# Patient Record
Sex: Female | Born: 1940 | Race: White | Hispanic: No | Marital: Married | Smoking: Never smoker
Health system: Southern US, Community
[De-identification: ages and names within clinical notes are randomized; demographics above are authoritative.]

---

## 2003-05-21 ENCOUNTER — Other Ambulatory Visit: Admission: RE | Admit: 2003-05-21 | Discharge: 2003-05-21 | Payer: Self-pay | Admitting: Obstetrics and Gynecology

## 2004-06-01 ENCOUNTER — Other Ambulatory Visit: Admission: RE | Admit: 2004-06-01 | Discharge: 2004-06-01 | Payer: Self-pay | Admitting: Internal Medicine

## 2005-07-14 ENCOUNTER — Other Ambulatory Visit: Admission: RE | Admit: 2005-07-14 | Discharge: 2005-07-14 | Payer: Self-pay | Admitting: Internal Medicine

## 2005-08-10 ENCOUNTER — Encounter: Admission: RE | Admit: 2005-08-10 | Discharge: 2005-08-10 | Payer: Self-pay | Admitting: Internal Medicine

## 2006-04-20 ENCOUNTER — Ambulatory Visit: Payer: Self-pay | Admitting: Internal Medicine

## 2006-04-26 ENCOUNTER — Encounter: Admission: RE | Admit: 2006-04-26 | Discharge: 2006-07-25 | Payer: Self-pay | Admitting: Internal Medicine

## 2006-05-29 ENCOUNTER — Ambulatory Visit: Payer: Self-pay | Admitting: Internal Medicine

## 2006-05-31 ENCOUNTER — Ambulatory Visit: Payer: Self-pay | Admitting: Internal Medicine

## 2006-06-14 ENCOUNTER — Encounter (INDEPENDENT_AMBULATORY_CARE_PROVIDER_SITE_OTHER): Payer: Self-pay | Admitting: *Deleted

## 2006-06-14 ENCOUNTER — Encounter (INDEPENDENT_AMBULATORY_CARE_PROVIDER_SITE_OTHER): Payer: Self-pay | Admitting: Specialist

## 2006-06-14 ENCOUNTER — Ambulatory Visit (HOSPITAL_COMMUNITY): Admission: RE | Admit: 2006-06-14 | Discharge: 2006-06-14 | Payer: Self-pay | Admitting: *Deleted

## 2006-08-17 ENCOUNTER — Ambulatory Visit (HOSPITAL_COMMUNITY): Admission: RE | Admit: 2006-08-17 | Discharge: 2006-08-17 | Payer: Self-pay | Admitting: *Deleted

## 2006-11-29 ENCOUNTER — Encounter: Admission: RE | Admit: 2006-11-29 | Discharge: 2006-11-29 | Payer: Self-pay | Admitting: Internal Medicine

## 2006-11-29 ENCOUNTER — Encounter (INDEPENDENT_AMBULATORY_CARE_PROVIDER_SITE_OTHER): Payer: Self-pay | Admitting: Diagnostic Radiology

## 2006-12-06 ENCOUNTER — Encounter: Admission: RE | Admit: 2006-12-06 | Discharge: 2006-12-06 | Payer: Self-pay | Admitting: Internal Medicine

## 2007-01-30 ENCOUNTER — Encounter (INDEPENDENT_AMBULATORY_CARE_PROVIDER_SITE_OTHER): Payer: Self-pay | Admitting: Surgery

## 2007-01-30 ENCOUNTER — Encounter: Admission: RE | Admit: 2007-01-30 | Discharge: 2007-01-30 | Payer: Self-pay | Admitting: Surgery

## 2007-01-30 ENCOUNTER — Ambulatory Visit (HOSPITAL_COMMUNITY): Admission: RE | Admit: 2007-01-30 | Discharge: 2007-01-30 | Payer: Self-pay | Admitting: Surgery

## 2007-05-01 ENCOUNTER — Ambulatory Visit: Payer: Self-pay | Admitting: Oncology

## 2007-05-16 LAB — COMPREHENSIVE METABOLIC PANEL
ALT: 21 U/L (ref 0–35)
CO2: 26 mEq/L (ref 19–32)
Calcium: 9.5 mg/dL (ref 8.4–10.5)
Chloride: 102 mEq/L (ref 96–112)
Creatinine, Ser: 0.68 mg/dL (ref 0.40–1.20)
Glucose, Bld: 111 mg/dL — ABNORMAL HIGH (ref 70–99)
Total Bilirubin: 0.4 mg/dL (ref 0.3–1.2)

## 2007-05-16 LAB — CBC WITH DIFFERENTIAL/PLATELET
Basophils Absolute: 0 10*3/uL (ref 0.0–0.1)
Eosinophils Absolute: 0.3 10*3/uL (ref 0.0–0.5)
HCT: 38.5 % (ref 34.8–46.6)
HGB: 13.7 g/dL (ref 11.6–15.9)
LYMPH%: 22.8 % (ref 14.0–48.0)
MCV: 84.6 fL (ref 81.0–101.0)
MONO%: 10.4 % (ref 0.0–13.0)
NEUT#: 3.8 10*3/uL (ref 1.5–6.5)
Platelets: 229 10*3/uL (ref 145–400)

## 2007-05-16 LAB — LACTATE DEHYDROGENASE: LDH: 174 U/L (ref 94–250)

## 2007-05-16 LAB — CANCER ANTIGEN 27.29: CA 27.29: 12 U/mL (ref 0–39)

## 2007-05-22 ENCOUNTER — Ambulatory Visit: Admission: RE | Admit: 2007-05-22 | Discharge: 2007-06-26 | Payer: Self-pay | Admitting: Radiation Oncology

## 2007-06-13 ENCOUNTER — Encounter: Admission: RE | Admit: 2007-06-13 | Discharge: 2007-06-13 | Payer: Self-pay | Admitting: Radiation Oncology

## 2007-07-02 ENCOUNTER — Ambulatory Visit: Admission: RE | Admit: 2007-07-02 | Discharge: 2007-08-29 | Payer: Self-pay | Admitting: Radiation Oncology

## 2007-07-02 ENCOUNTER — Ambulatory Visit: Payer: Self-pay | Admitting: Oncology

## 2007-07-31 LAB — CBC WITH DIFFERENTIAL/PLATELET
EOS%: 5.8 % (ref 0.0–7.0)
HGB: 14.2 g/dL (ref 11.6–15.9)
MCH: 30.4 pg (ref 26.0–34.0)
MCV: 86.7 fL (ref 81.0–101.0)
MONO%: 10.3 % (ref 0.0–13.0)
NEUT#: 3.4 10*3/uL (ref 1.5–6.5)
RBC: 4.67 10*6/uL (ref 3.70–5.32)
RDW: 14 % (ref 11.3–14.5)
lymph#: 0.9 10*3/uL (ref 0.9–3.3)

## 2007-08-30 ENCOUNTER — Ambulatory Visit: Admission: RE | Admit: 2007-08-30 | Discharge: 2007-09-13 | Payer: Self-pay | Admitting: Radiation Oncology

## 2007-10-26 ENCOUNTER — Ambulatory Visit: Payer: Self-pay | Admitting: Oncology

## 2008-02-11 ENCOUNTER — Encounter: Admission: RE | Admit: 2008-02-11 | Discharge: 2008-02-11 | Payer: Self-pay | Admitting: Oncology

## 2008-02-20 ENCOUNTER — Encounter: Admission: RE | Admit: 2008-02-20 | Discharge: 2008-02-20 | Payer: Self-pay | Admitting: Obstetrics and Gynecology

## 2008-04-01 ENCOUNTER — Ambulatory Visit: Payer: Self-pay | Admitting: Oncology

## 2008-04-01 LAB — CBC WITH DIFFERENTIAL/PLATELET
Basophils Absolute: 0 10*3/uL (ref 0.0–0.1)
Eosinophils Absolute: 0 10*3/uL (ref 0.0–0.5)
HGB: 12.8 g/dL (ref 11.6–15.9)
MCV: 89.5 fL (ref 81.0–101.0)
NEUT#: 7.5 10*3/uL — ABNORMAL HIGH (ref 1.5–6.5)
RDW: 13 % (ref 11.3–14.5)
lymph#: 0.4 10*3/uL — ABNORMAL LOW (ref 0.9–3.3)

## 2008-10-20 ENCOUNTER — Ambulatory Visit: Payer: Self-pay | Admitting: Oncology

## 2008-10-22 LAB — COMPREHENSIVE METABOLIC PANEL
AST: 17 U/L (ref 0–37)
BUN: 15 mg/dL (ref 6–23)
Calcium: 9.1 mg/dL (ref 8.4–10.5)
Chloride: 102 mEq/L (ref 96–112)
Creatinine, Ser: 0.63 mg/dL (ref 0.40–1.20)
Total Bilirubin: 0.4 mg/dL (ref 0.3–1.2)

## 2008-10-22 LAB — CBC WITH DIFFERENTIAL/PLATELET
Basophils Absolute: 0 10*3/uL (ref 0.0–0.1)
EOS%: 4.2 % (ref 0.0–7.0)
HCT: 38.1 % (ref 34.8–46.6)
HGB: 13.2 g/dL (ref 11.6–15.9)
LYMPH%: 22.6 % (ref 14.0–49.7)
MCH: 30.5 pg (ref 25.1–34.0)
MCV: 88 fL (ref 79.5–101.0)
NEUT%: 65.6 % (ref 38.4–76.8)
Platelets: 150 10*3/uL (ref 145–400)
lymph#: 1 10*3/uL (ref 0.9–3.3)

## 2008-10-22 LAB — CANCER ANTIGEN 27.29: CA 27.29: 16 U/mL (ref 0–39)

## 2008-11-17 LAB — RESEARCH LABS

## 2008-11-18 IMAGING — MG MM DIAGNOSTIC BILATERAL
8 series · 8 of 8 positions shown · non-contrast
Comparison: [DATE] [DATE], [DATE], [DATE] [DATE], [DATE], [DATE] [DATE], [DATE], [DATE]

Addendum Begins

The patient returned on 02/20/2008 for scheduled stereotactic
biopsy.
Prior mammogram from [HOSPITAL] dated 08/17/2004 and
05/27/2003 have now been obtained. The two clusters of
calcifications within the central and lateral right breast
recommended for biopsy, were present on the prior studies from 3669
and 7669.  The calcifications on the 02/11/2008 mammogram are more
conspicuous, but felt to be secondary to digital technique.  These
calcifications also have a benign type appearance.
These right breast calcifications, given their stability and
appearance are felt to be benign.  These findings were discussed
with the patient and the stereotactic biopsy cancelled.
Recommend 6-month diagnostic right mammogram for follow-up, which
was agreeable to the patient.
BI-RADS CATEGORY 3:  Probably benign finding(s) - short interval
follow-up suggested.
Recommend right diagnostic mammogram in 6 months.
Addendum Ends
CLINICAL DATA: History of left breast cancer status post lumpectomy
in 0335.
DIGITAL DIAGNOSTIC BILATERAL MAMMOGRAM WITH COMPUTER AIDED
DETECTION February 11, 2008

[R CC (1 of 3)]
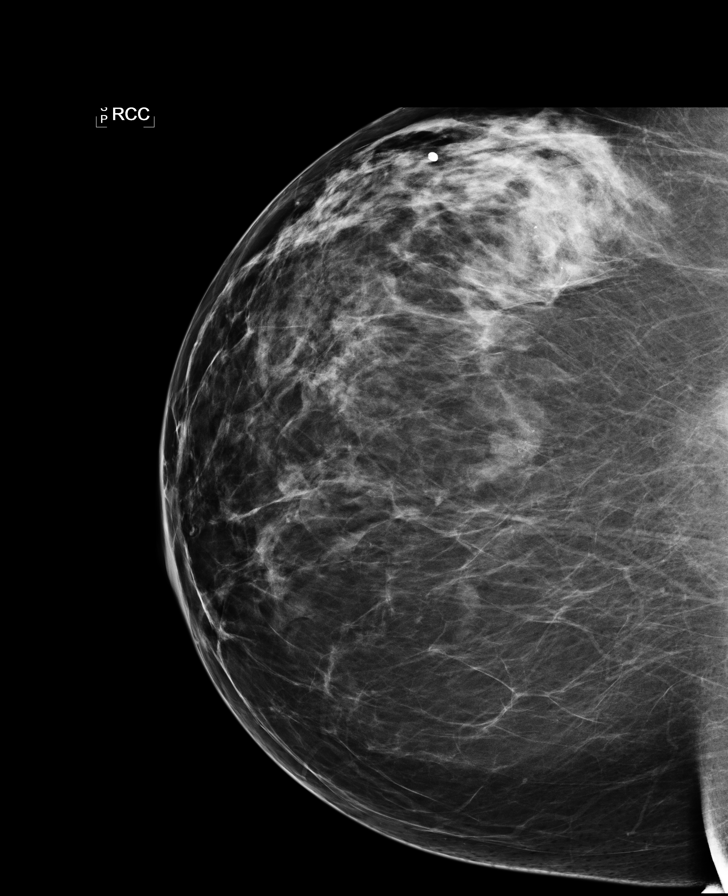

[R CC (2 of 3)]
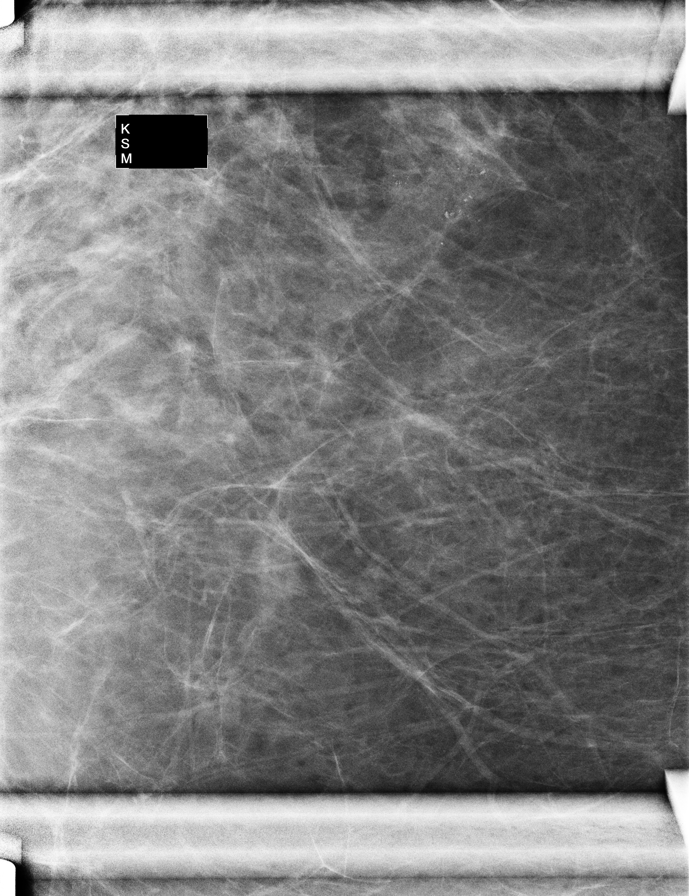

[L CC]
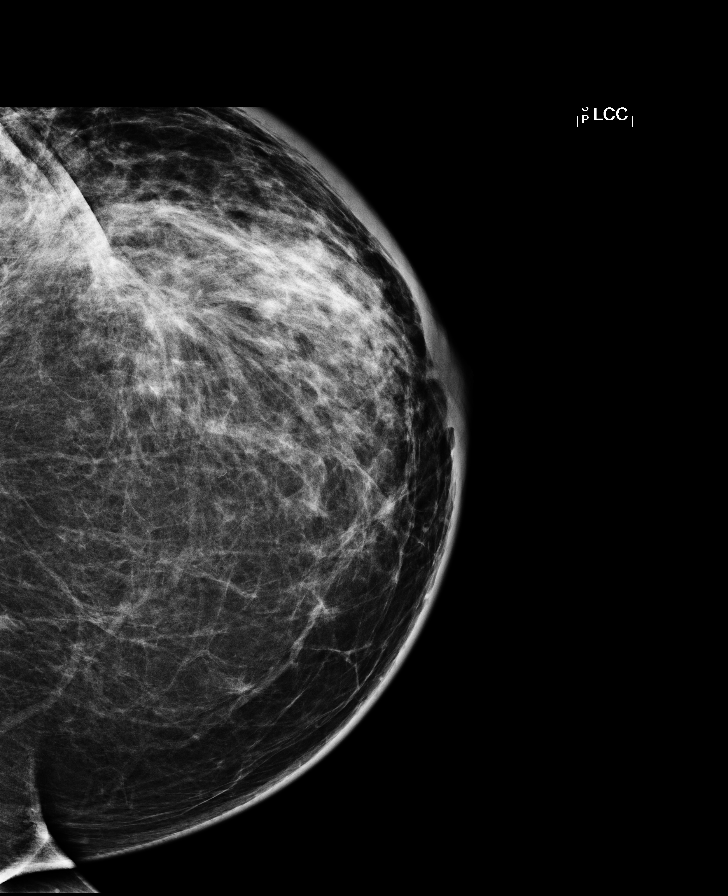

[L MLO]
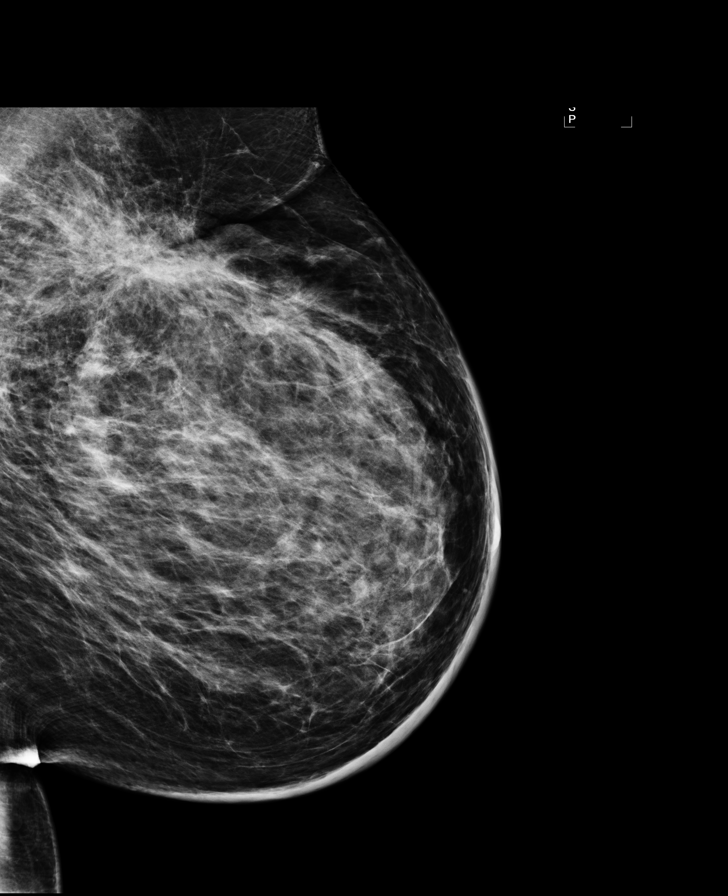

[R MLO]
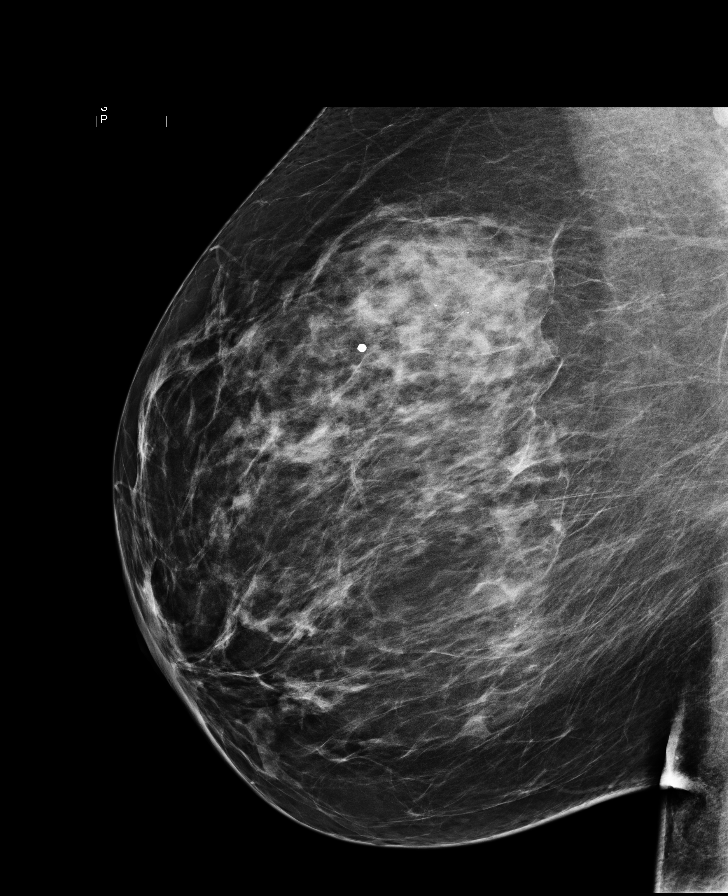

[L TAN]
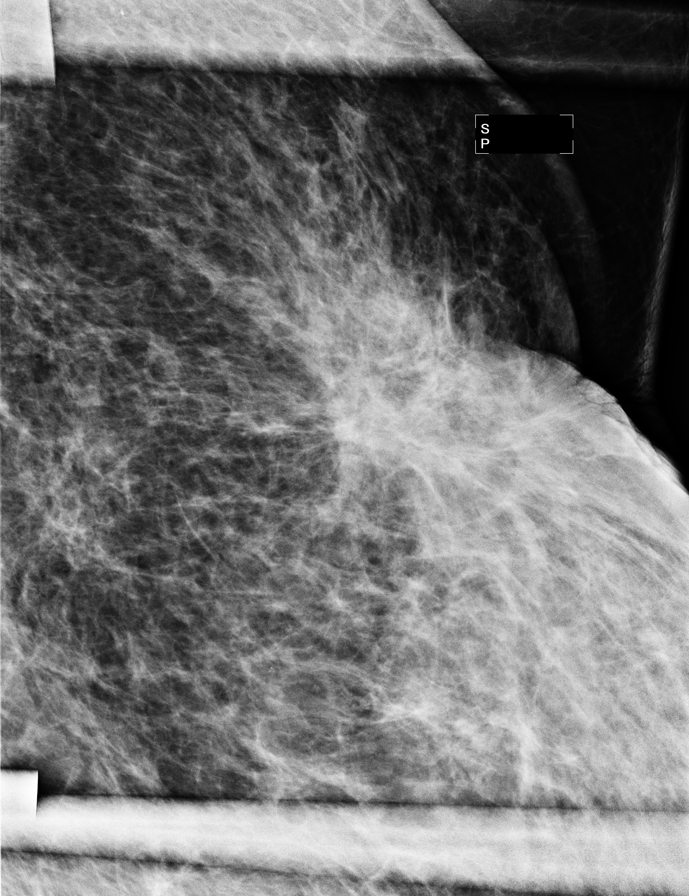

[R CC (3 of 3)]
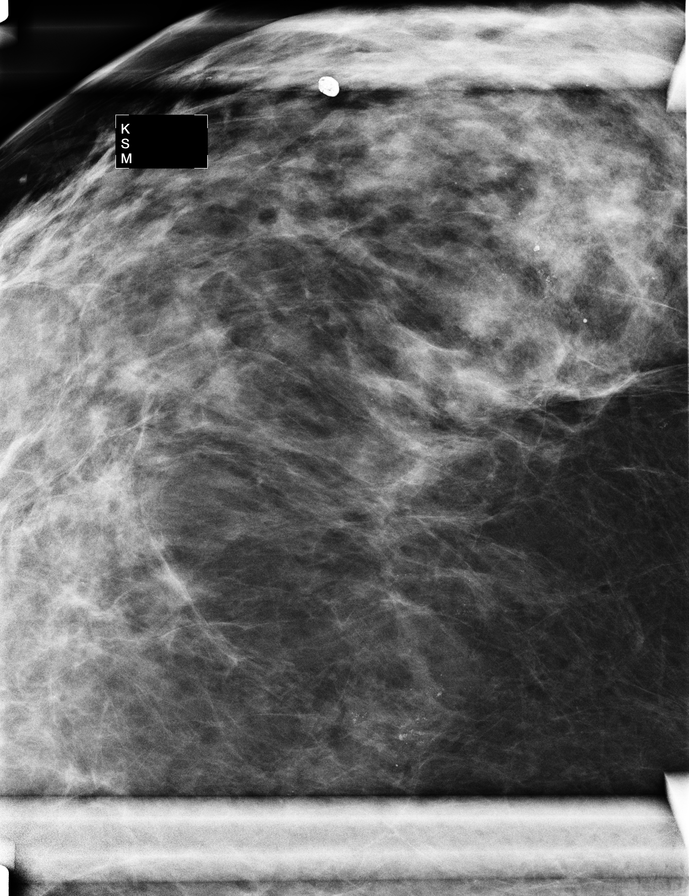

[R ML]
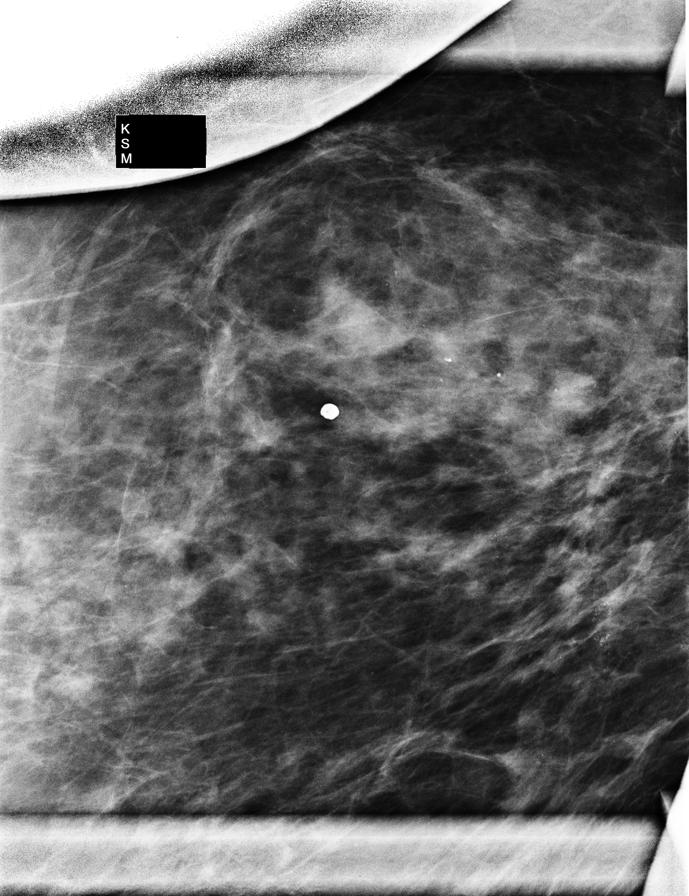

[8 of 8 positions shown; findings below may reference images not displayed]

FINDINGS: CC and MLO views of bilateral breast and spot
magnification right CC and right lateral view as well as spot
tangential view of the left breast are submitted for review.
Stable postsurgical scar is noted in the left breast.  Images of
the right breast demonstrate two clusters of indeterminate
microcalcifications in the right breast one in the central right
breast as seen on the right CC view and the other in the lateral
right breast is seen on the right CC view.
IMPRESSION: Suspicious findings, recommend stereotactic biopsy of both clusters
of microcalcifications in the right breast.  The stereotactic
biopsy was offered to the patient today; however the patient stated
that she could not stay for the biopsy and the patient is scheduled
for stereotactic biopsy on February 20, 2008 at [DATE] p.m.

BI-RADS CATEGORY 4:  Suspicious abnormality - biopsy should be
considered.

## 2009-04-27 ENCOUNTER — Ambulatory Visit: Payer: Self-pay | Admitting: Oncology

## 2009-05-11 LAB — COMPREHENSIVE METABOLIC PANEL
Albumin: 4.1 g/dL (ref 3.5–5.2)
Alkaline Phosphatase: 46 U/L (ref 39–117)
BUN: 12 mg/dL (ref 6–23)
Glucose, Bld: 126 mg/dL — ABNORMAL HIGH (ref 70–99)
Total Bilirubin: 0.5 mg/dL (ref 0.3–1.2)

## 2009-05-11 LAB — CBC WITH DIFFERENTIAL/PLATELET
Basophils Absolute: 0 10*3/uL (ref 0.0–0.1)
EOS%: 4.4 % (ref 0.0–7.0)
HCT: 38.4 % (ref 34.8–46.6)
HGB: 13.4 g/dL (ref 11.6–15.9)
LYMPH%: 24.4 % (ref 14.0–49.7)
MCH: 30.1 pg (ref 25.1–34.0)
MCV: 86.3 fL (ref 79.5–101.0)
MONO%: 9.6 % (ref 0.0–14.0)
NEUT%: 61.4 % (ref 38.4–76.8)

## 2009-09-10 ENCOUNTER — Ambulatory Visit: Payer: Self-pay | Admitting: Gastroenterology

## 2009-09-10 DIAGNOSIS — C50919 Malignant neoplasm of unspecified site of unspecified female breast: Secondary | ICD-10-CM | POA: Insufficient documentation

## 2009-09-10 DIAGNOSIS — Z860100 Personal history of colon polyps, unspecified: Secondary | ICD-10-CM | POA: Insufficient documentation

## 2009-09-10 DIAGNOSIS — I10 Essential (primary) hypertension: Secondary | ICD-10-CM | POA: Insufficient documentation

## 2009-09-24 ENCOUNTER — Ambulatory Visit: Payer: Self-pay | Admitting: Gastroenterology

## 2009-10-21 ENCOUNTER — Ambulatory Visit: Payer: Self-pay | Admitting: Oncology

## 2009-11-19 LAB — CBC WITH DIFFERENTIAL/PLATELET
BASO%: 0.2 % (ref 0.0–2.0)
Basophils Absolute: 0 10*3/uL (ref 0.0–0.1)
EOS%: 3.8 % (ref 0.0–7.0)
Eosinophils Absolute: 0.2 10*3/uL (ref 0.0–0.5)
HCT: 37.2 % (ref 34.8–46.6)
HGB: 13 g/dL (ref 11.6–15.9)
LYMPH%: 18.2 % (ref 14.0–49.7)
MCH: 31.9 pg (ref 25.1–34.0)
MCHC: 34.8 g/dL (ref 31.5–36.0)
MCV: 91.7 fL (ref 79.5–101.0)
MONO#: 0.3 10*3/uL (ref 0.1–0.9)
MONO%: 6.5 % (ref 0.0–14.0)
NEUT#: 3.7 10*3/uL (ref 1.5–6.5)
NEUT%: 71.3 % (ref 38.4–76.8)
Platelets: 178 10*3/uL (ref 145–400)
RBC: 4.06 10*6/uL (ref 3.70–5.45)
RDW: 13 % (ref 11.2–14.5)
WBC: 5.2 10*3/uL (ref 3.9–10.3)
lymph#: 0.9 10*3/uL (ref 0.9–3.3)

## 2009-11-19 LAB — COMPREHENSIVE METABOLIC PANEL
ALT: 26 U/L (ref 0–35)
AST: 25 U/L (ref 0–37)
Albumin: 4.4 g/dL (ref 3.5–5.2)
Alkaline Phosphatase: 47 U/L (ref 39–117)
BUN: 13 mg/dL (ref 6–23)
CO2: 26 mEq/L (ref 19–32)
Calcium: 9.6 mg/dL (ref 8.4–10.5)
Chloride: 105 mEq/L (ref 96–112)
Creatinine, Ser: 0.66 mg/dL (ref 0.40–1.20)
Glucose, Bld: 138 mg/dL — ABNORMAL HIGH (ref 70–99)
Potassium: 3.8 mEq/L (ref 3.5–5.3)
Sodium: 141 mEq/L (ref 135–145)
Total Bilirubin: 0.4 mg/dL (ref 0.3–1.2)
Total Protein: 6.8 g/dL (ref 6.0–8.3)

## 2009-11-19 LAB — CANCER ANTIGEN 27.29: CA 27.29: 19 U/mL (ref 0–39)

## 2009-11-24 ENCOUNTER — Ambulatory Visit: Payer: Self-pay | Admitting: Oncology

## 2010-05-26 ENCOUNTER — Ambulatory Visit: Payer: Self-pay | Admitting: Oncology

## 2010-07-02 ENCOUNTER — Ambulatory Visit: Payer: Self-pay | Admitting: Oncology

## 2010-07-06 LAB — COMPREHENSIVE METABOLIC PANEL
ALT: 20 U/L (ref 0–35)
AST: 21 U/L (ref 0–37)
Albumin: 4.7 g/dL (ref 3.5–5.2)
Alkaline Phosphatase: 47 U/L (ref 39–117)
BUN: 16 mg/dL (ref 6–23)
CO2: 23 mEq/L (ref 19–32)
Calcium: 9.2 mg/dL (ref 8.4–10.5)
Chloride: 102 mEq/L (ref 96–112)
Creatinine, Ser: 0.71 mg/dL (ref 0.40–1.20)
Glucose, Bld: 129 mg/dL — ABNORMAL HIGH (ref 70–99)
Potassium: 3.5 mEq/L (ref 3.5–5.3)
Sodium: 138 mEq/L (ref 135–145)
Total Bilirubin: 0.5 mg/dL (ref 0.3–1.2)
Total Protein: 6.8 g/dL (ref 6.0–8.3)

## 2010-07-06 LAB — CBC WITH DIFFERENTIAL/PLATELET
BASO%: 0 % (ref 0.0–2.0)
Basophils Absolute: 0 10*3/uL (ref 0.0–0.1)
EOS%: 3.4 % (ref 0.0–7.0)
Eosinophils Absolute: 0.2 10*3/uL (ref 0.0–0.5)
HCT: 38.5 % (ref 34.8–46.6)
HGB: 13.4 g/dL (ref 11.6–15.9)
LYMPH%: 18.7 % (ref 14.0–49.7)
MCH: 31.3 pg (ref 25.1–34.0)
MCHC: 34.7 g/dL (ref 31.5–36.0)
MCV: 90 fL (ref 79.5–101.0)
MONO#: 0.3 10*3/uL (ref 0.1–0.9)
MONO%: 4.9 % (ref 0.0–14.0)
NEUT#: 4.1 10*3/uL (ref 1.5–6.5)
NEUT%: 73 % (ref 38.4–76.8)
Platelets: 187 10*3/uL (ref 145–400)
RBC: 4.28 10*6/uL (ref 3.70–5.45)
RDW: 12.8 % (ref 11.2–14.5)
WBC: 5.6 10*3/uL (ref 3.9–10.3)
lymph#: 1 10*3/uL (ref 0.9–3.3)

## 2010-07-06 LAB — CANCER ANTIGEN 27.29: CA 27.29: 16 U/mL (ref 0–39)

## 2010-08-06 ENCOUNTER — Ambulatory Visit: Payer: Self-pay | Admitting: Oncology

## 2010-09-19 ENCOUNTER — Encounter: Payer: Self-pay | Admitting: Surgery

## 2010-09-28 NOTE — Letter (Signed)
Summary: [facility] Instructions  [facility]  [address] [place], [place] [address]   Phone: [phone]  Fax: [phone]       YLIANA GRAVOIS    02/19/1941    MRN: [id number]        Procedure Day /Date:TUESDAY 09/22/2009     Arrival Time:10:30AM     Procedure Time:11:30AM     Location of Procedure:                    X  [facility] (4th Floor)   PREPARATION FOR COLONOSCOPY WITH MOVIPREP   Starting 5 days prior to your procedure 09/17/2009 do not eat nuts, seeds, popcorn, corn, beans, peas,  salads, or any raw vegetables.  Do not take any fiber supplements (e.g. Metamucil, Citrucel, and Benefiber).  THE DAY BEFORE YOUR PROCEDURE         DATE: 09/21/2009  DAY: MONDAY  1.  Drink clear liquids the entire day-NO SOLID FOOD  2.  Do not drink anything colored red or purple.  Avoid juices with pulp.  No orange juice.  3.  Drink at least 64 oz. (8 glasses) of fluid/clear liquids during the day to prevent dehydration and help the prep work efficiently.  CLEAR LIQUIDS INCLUDE: Water Jello Ice Popsicles Tea (sugar ok, no milk/cream) Powdered fruit flavored drinks Coffee (sugar ok, no milk/cream) Gatorade Juice: apple, white grape, white cranberry  Lemonade Clear bullion, consomm, broth Carbonated beverages (any kind) Strained chicken noodle soup Hard Candy                             4.  In the morning, mix first dose of MoviPrep solution:    Empty 1 Pouch A and 1 Pouch B into the disposable container    Add lukewarm drinking water to the top line of the container. Mix to dissolve    Refrigerate (mixed solution should be used within 24 hrs)  5.  Begin drinking the prep at 5:00 p.m. The MoviPrep container is divided by 4 marks.   Every 15 minutes drink the solution down to the next mark (approximately 8 oz) until the full liter is complete.   6.  Follow completed prep with 16 oz of clear liquid of your choice (Nothing red or purple).   Continue to drink clear liquids until bedtime.  7.  Before going to bed, mix second dose of MoviPrep solution:    Empty 1 Pouch A and 1 Pouch B into the disposable container    Add lukewarm drinking water to the top line of the container. Mix to dissolve    Refrigerate  THE DAY OF YOUR PROCEDURE      DATE: 09/22/2009 DAY: TUESDAY  Beginning at 6:30a.m. (5 hours before procedure):         1. Every 15 minutes, drink the solution down to the next mark (approx 8 oz) until the full liter is complete.  2. Follow completed prep with 16 oz. of clear liquid of your choice.    3. You may drink clear liquids until 9:30AM (2 HOURS BEFORE PROCEDURE).   MEDICATION INSTRUCTIONS  Unless otherwise instructed, you should take regular prescription medications with a small sip of water   as early as possible the morning of your procedure.   HOLD IRON 7 DAYS PRIOR TO PROCEDURE       OTHER INSTRUCTIONS  You will need a responsible adult at least 69  years of age to accompany you and drive you home.   This person must remain in the waiting room during your procedure.  Wear loose fitting clothing that is easily removed.  Leave jewelry and other valuables at home.  However, you may wish to bring a book to read or  an iPod/MP3 player to listen to music as you wait for your procedure to start.  Remove all body piercing jewelry and leave at home.  Total time from sign-in until discharge is approximately 2-3 hours.  You should go home directly after your procedure and rest.  You can resume normal activities the  day after your procedure.  The day of your procedure you should not:   Drive   Make legal decisions   Operate machinery   Drink alcohol   Return to work  You will receive specific instructions about eating, activities and medications before you leave.    The above instructions have been reviewed and explained to me by   _______________________    I fully understand and  can verbalize these instructions _____________________________ Date _________

## 2010-09-28 NOTE — Procedures (Signed)
Summary: Colonoscopy  Patient: [name] Note: All result statuses are Final unless otherwise noted.  Tests: (1) Colonoscopy (COL)   COL Colonoscopy           DONE     [facility]     [address]     [place], [place]  [address]           COLONOSCOPY PROCEDURE REPORT           PATIENT:  Miranda Brady, Miranda Brady  MR#:  [id number]     BIRTHDATE:  [birth year], 68 yrs. old  GENDER:  female           ENDOSCOPIST:  Barbette Hair. Arlyce Dice, MD     Referred by:           PROCEDURE DATE:  09/24/2009     PROCEDURE:  Colonoscopy, Diagnostic     ASA CLASS:  Class II     INDICATIONS:  history of pre-cancerous (adenomatous) colon polyps                 MEDICATIONS:   Fentanyl 50 mcg IV, Versed 6 mg IV           DESCRIPTION OF PROCEDURE:   After the risks benefits and     alternatives of the procedure were thoroughly explained, informed     consent was obtained.  Digital rectal exam was performed and     revealed no abnormalities.   The LB CF-[id number] endoscope     was introduced through the anus and advanced to the cecum, which     was identified by both the appendix and ileocecal valve, without     limitations.  The quality of the prep was excellent, using     MoviPrep.  The instrument was then slowly withdrawn as the colon     was fully examined.     <<PROCEDUREIMAGES>>           FINDINGS:  Mild diverticulosis was found in the sigmoid colon (see     image13 and image12).  This was otherwise a normal examination of     the colon (see image1, image2, image3, image6, image7, image10,     image15, and image16).   Retroflexed views in the rectum revealed     no abnormalities.    The scope was then withdrawn from the patient     and the procedure completed.           COMPLICATIONS:  None           ENDOSCOPIC IMPRESSION:     1) Mild diverticulosis in the sigmoid colon     2) Otherwise normal examination     RECOMMENDATIONS:     1) colonoscopy in 10 years           REPEAT EXAM:   In 10 year(s) for Colonoscopy.           ______________________________     Barbette Hair. Arlyce Dice, MD           CC:  Juline Patch, MD           n.     Rosalie Doctor:   Barbette Hair. Raji Glinski at 09/24/2009 10:42 AM           Lorelei Pont, [phone]  Note: An exclamation mark (!) indicates a result that was not dispersed into the flowsheet. Document Creation Date: 09/24/2009 10:42 AM _______________________________________________________________________  (1) Order result status: Final Collection or observation date-time: 09/24/2009 10:36 Requested date-time:  Receipt date-time:  Reported date-time:  Referring Physician:   Ordering Physician: Melvia Heaps [phone]) Specimen Source:  Source: Launa Grill Order Number: [id number] Lab site:

## 2010-09-28 NOTE — Letter (Signed)
Summary: Generic Letter  [facility]  [address] [place], [place] [address]   Phone: [phone]  Fax: [phone]    09/10/2009  [facility] [address] [place], [place]  [address]  Dear Ms. Jessop,  It is my pleasure to have treated you recently as a new patient in my office. I appreciate your confidence and the opportunity to participate in your care.  Since I do have a busy inpatient endoscopy schedule and office schedule, my office hours vary weekly. I am, however, available for emergency calls everyday through my office. If I am not available for an urgent office appointment, another one of our gastroenterologist will be able to assist you.  My well-trained staff are prepared to help you at all times. For emergencies after office hours, a physician from our Gastroenterology section is always available through my 24 hour answering service  Once again I welcome you as a new patient and I look forward to a happy and healthy relationship               Sincerely,   Melvia Heaps MD  Appended Document: Generic Letter letter mailed

## 2010-09-28 NOTE — Assessment & Plan Note (Signed)
Summary: ANEMIA.Marland KitchenMarland KitchenEM   History of Present Illness Visit Type: Initial Consult Primary GI MD: Melvia Heaps MD [facility] Primary Provider: Juline Patch, MD Requesting Provider: Juline Patch, MD Chief Complaint: h/o Anemia needs Colonoscopy History of Present Illness:   Ms. Mahadeo is a  70 year old white female referred at the request of Dr. Ricki Miller  for colonoscopy.  She has a history of colon polyps.  Colonoscopy several years ago was done for an iron  deficiency anemia in the setting of NSAID use.  This exam apparently was negative.  Capsule endoscopy and upper endoscopy were also unrevealing.  She remains on supplementary iron and has no GI complaints including change of bowel habits, abdominal pain, melena or hematochezia.  To her knowledge her blood counts have been stable and normal.   GI Review of Systems      Denies abdominal pain, acid reflux, belching, bloating, chest pain, dysphagia with liquids, dysphagia with solids, heartburn, loss of appetite, nausea, vomiting, vomiting blood, weight loss, and  weight gain.        Denies anal fissure, black tarry stools, change in bowel habit, constipation, diarrhea, diverticulosis, fecal incontinence, heme positive stool, hemorrhoids, irritable bowel syndrome, jaundice, light color stool, liver problems, rectal bleeding, and  rectal pain. Preventive Screening-Counseling & Management  Alcohol-Tobacco     Smoking Status: quit      Drug Use:  no.      Current Medications (verified): 1)  Requip 4 Mg Tabs (Ropinirole Hcl) .Marland Kitchen.. 1 By Mouth Once Daily 2)  Amlodipine Besylate 5 Mg Tabs (Amlodipine Besylate) .Marland Kitchen.. 1 By Mouth Once Daily 3)  Metoprolol Tartrate 100 Mg Tabs (Metoprolol Tartrate) .Marland Kitchen.. 1 By Mouth Once Daily 4)  Hydrochlorothiazide 25 Mg Tabs (Hydrochlorothiazide) .Marland Kitchen.. 1 By Mouth Once Daily 5)  Tandem Plus 162-115.2-1 Mg Caps (Fefum-Fepo-Fa-B Cmp-C-Zn-Mn-Cu) .Marland Kitchen.. 1 By Mouth Once Daily 6)  Prilosec 20 Mg Cpdr (Omeprazole) .... As Needed 7)   Tramadol Hcl 50 Mg Tabs (Tramadol Hcl) .Marland Kitchen.. 1 By Mouth Once Daily As Needed 8)  Tamoxifen Citrate 20 Mg Tabs (Tamoxifen Citrate) .... Take 1 Tablet By Mouth Once Daily 9)  Vitamin B-12 100 Mcg Tabs (Cyanocobalamin) .... Take 1 Two Times A Day 10)  Vitamin D 1000 Unit Tabs (Cholecalciferol) .... Take 1 Tablet By Mouth Once Daily 11)  Aspirin Ec Low Strength 81 Mg Tbec (Aspirin) .Marland Kitchen.. 1 Tablet By Mouth Once Daily 12)  Calcium 600 1500 Mg Tabs (Calcium Carbonate) .Marland Kitchen.. 1 Tablet By Mouth Once Daily 13)  Fish Oil 1000 Mg Caps (Omega-3 Fatty Acids) .Marland Kitchen.. 1 Capsule By Mouth Once Daily  Allergies (verified): 1)  ! * Statins  Past History:  Past Medical History: Anemia Hypertension Arthritis Breast Cancer  Past Surgical History: Breast Lumpectomy x 2   Family History: Reviewed history and no changes required. Family History of Breast Cancer:sister died  No FH of Colon Cancer:  Social History: Reviewed history and no changes required. Occupation: [employer] for [employer]. Patient is a former smoker.  In College Alcohol Use - yes Glass of wine every night Illicit Drug Use - no Married  1 boy 1 girlSmoking Status:  quit Drug Use:  no  Review of Systems       The patient complains of anemia, arthritis/joint pain, and sleeping problems.  The patient denies allergy/sinus, anxiety-new, back pain, blood in urine, breast changes/lumps, change in vision, confusion, cough, coughing up blood, depression-new, fainting, fatigue, fever, headaches-new, hearing problems, heart murmur, heart rhythm changes, itching, menstrual pain, muscle  pains/cramps, night sweats, nosebleeds, pregnancy symptoms, shortness of breath, skin rash, sore throat, swelling of feet/legs, swollen lymph glands, thirst - excessive , urination - excessive , urination changes/pain, urine leakage, vision changes, and voice change.         All other systems were reviewed and were negative   Vital Signs:  Patient profile:   70  year old female Height:      67 inches Weight:      222.50 pounds BMI:     34.97 Pulse rate:   60 / minute Pulse rhythm:   regular BP sitting:   110 / 70  (left arm)  Vitals Entered By: Milford Cage NCMA (September 10, 2009 11:17 AM)  Physical Exam  Additional Exam:  She is a well-developed well-nourished female  skin: anicteric HEENT: normocephalic; PEERLA; no nasal or pharyngeal abnormalities neck: supple nodes: no cervical lymphadenopathy chest: clear to ausculatation and percussion heart: no murmurs, gallops, or rubs abd: soft, nontender; BS normoactive; no abdominal masses, tenderness, organomegaly rectal: deferred ext: no cynanosis, clubbing, edema skeletal: no deformities neuro: oriented x 3; no focal abnormalities    Impression & Recommendations:  Problem # 1:  PERSONAL HISTORY OF COLONIC POLYPS (ICD-V12.72)  Plan followup colonoscopy  Risks, alternatives, and complications of the procedure, including bleeding, perforation, and possible need for surgery, were explained to the patient.  Patient's questions were answered.  Orders: Colonoscopy (Colon)  Problem # 2:  Hx of ADENOCARCINOMA, LEFT BREAST (ICD-174.9) Assessment: Comment Only  Problem # 3:  ESSENTIAL HYPERTENSION (ICD-401.9) Assessment: Comment Only  Patient Instructions: 1)  Colonoscopy and Flexible Sigmoidoscopy brochure given.  2)  Conscious Sedation brochure given.  3)  Your colonoscopy is scheduled for 09/22/2009 at 11:30am 4)  You can pick up your MoviePrep from your pharmacy today 5)  You will hold your Iron 7 days prior to your procedure 6)  CC Dr. Ricki Miller 7)  The medication list was reviewed and reconciled.  All changed / newly prescribed medications were explained.  A complete medication list was provided to the patient / caregiver. Prescriptions: MOVIPREP 100 GM  SOLR (PEG-KCL-NACL-NASULF-NA ASC-C) As per prep instructions.  #1 x 0   Entered by:   Merri Ray CMA (AAMA)   Authorized by:    Louis Meckel MD   Signed by:   Merri Ray CMA (AAMA) on 09/10/2009   Method used:   Electronically to        [facility] [address] [phone]* (retail)       [address] [place], [place]  [address]       Ph: [phone]       Fax: [phone]   RxID:   [id number]

## 2011-01-11 NOTE — Op Note (Signed)
Miranda Brady, Miranda Brady              ACCOUNT NO.:  [id number]   MEDICAL RECORD NO.:  [id number]          PATIENT TYPE:  AMB   LOCATION:  SDS                          FACILITY:  [facility]   PHYSICIAN:  Thomas A. 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   PROCEDURE:  Left breast needle-localized excisional lumpectomy using  three wires for a bracketed excision.   ANESTHESIA:  LMA (laryngeal mask airway) with 20 mL of 0.25%  Sensorcaine.   ESTIMATED BLOOD LOSS:  Approximately 20 mL.   SPECIMEN:  Left breast tissue with three localizing wires and previously  placed clip verified by radiography to be adequate.   HISTORY OF PRESENT ILLNESS:  The patient is a 70 year old female with  left breast microcalcifications.  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  She also had a second area adjacent to it.  The entire area  was about 5 cm of calcifications and it was felt these could be excised  with needle localization using a bracketed approach.  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   DESCRIPTION OF PROCEDURE:  The patient was brought to the operating room  after undergoing bracketed needle localization of her left breast  calcifications.  After sterile prep and drape and LMA anesthesia, the  area was prepped and draped in a sterile fashion and 0.25% Marcaine with  epinephrine was used for local anesthesia.  An incision was made in a  curvilinear position in the left upper outer quadrant in between all  three wires.  I was able to find all three wires and pull this out the  incision.  I then used cautery to dissect the entire area of tissue  encompassing the wires.  Once the entire area  was removed, I oriented  the specimen, sent it to radiology and this was read as being adequate  from the standpoint of having calcifications and clips and wires all  intact.  I then closed the wound with 3-0 Vicryl as a deep layer and 4-0  Monocryl with a subcuticular stitch.  Steri-Strips and dry dressings  were applied.  Of note, hemostasis was excellent and the wound was  irrigated prior to closing.  Dermabond was then applied to the skin  edges.  The patient was awakened and taken to recovery in satisfactory  condition.      Thomas A. Cornett, M.D.  Electronically Signed     TAC/MEDQ  D:  01/30/2007  T:  01/30/2007  Job:  [id number]

## 2011-01-14 NOTE — Assessment & Plan Note (Signed)
[facility]                               PULMONARY OFFICE NOTE   NAME:Miranda Brady, Miranda Brady                     MRN:          [id number]  DATE:05/31/2006                            DOB:          [birth year]    HISTORY:  A 70 year old white female remote smoker in for follow-up PFTs for  variable dyspnea that has been present for eight months.  Sometimes it is  present going up steps and sometimes not.  It is not associated with any  resting or nocturnal episodes of dyspnea.  She has undergone a cardiac work-  up for this in [place], [place], but does not know the name of the  physician.  She denies any exertional chest pain, orthopnea, PND, leg  swelling, associated cough, subjective wheeze or hoarseness.   MEDICATIONS:  1. Requip.  2. Amlodipine.  3. Hydrochlorothiazide.  4. Metoprolol.  For full dosing schedule, please see face sheet column dated March 31, 2006.   PHYSICAL EXAMINATION:  GENERAL APPEARANCE:  She is a pale, but not ill-  appearing, pleasant, ambulatory, obese white female in no acute distress.  VITAL SIGNS:  She is afebrile with stable vital signs.  Weight is 230 pounds  which is no change in baseline.  HEENT:  Unremarkable.  Oropharynx clear.  LUNGS:  Lung fields are clear bilaterally to auscultation and percussion.  CARDIOVASCULAR:  Regular rhythm with murmurs, rubs, or gallops.  ABDOMEN:  Soft and benign.  EXTREMITIES:  Warm without calf tenderness, clubbing, cyanosis, or edema.   PFTs were normal except for a truncated inspiratory loop.  There was no  expiratory flow limitation.  DLCO was 63% not corrected for anemia.   IMPRESSION:  Variable dyspnea with exertion that is not present at rest or  sleeping or associated with cough, is most likely not asthma but we have not  excluded this diagnosis.  Her cardiac work-up was reported to be normal and  included a stress test but they said she did poorly because of  shortness of  breath.   If she indeed does have reproducible dyspnea on exertion, we should be able  to reproduce it in the lab.  I have recommended a baseline CBC, TSH, BNP and  sed rate and I scheduled her for a CVST on June 19, 2006, with before and  after bronchodilator testing.  I did not recommend  anything further be done other than more consistent paced exercise in the  meantime to rule out obesity/deconditioning which I think is the most likely  diagnosis.            ______________________________  Charlaine Dalton Sherene Sires, MD, [facility]      MBW/MedQ  DD:  05/31/2006  DT:  06/02/2006  Job #:  [id number]   cc:   Marcene Duos, M.D.

## 2011-03-07 ENCOUNTER — Other Ambulatory Visit: Payer: Self-pay | Admitting: Orthopedic Surgery

## 2011-03-07 ENCOUNTER — Other Ambulatory Visit (HOSPITAL_COMMUNITY): Payer: Self-pay | Admitting: Orthopedic Surgery

## 2011-03-07 ENCOUNTER — Ambulatory Visit (HOSPITAL_COMMUNITY)
Admission: RE | Admit: 2011-03-07 | Discharge: 2011-03-07 | Disposition: A | Discharge status: Home / Self Care | Payer: Medicare Other | Source: Ambulatory Visit | Attending: Orthopedic Surgery | Admitting: Orthopedic Surgery

## 2011-03-07 ENCOUNTER — Other Ambulatory Visit (HOSPITAL_COMMUNITY): Payer: Self-pay

## 2011-03-07 ENCOUNTER — Encounter (HOSPITAL_COMMUNITY): Payer: Medicare Other

## 2011-03-07 DIAGNOSIS — Z01818 Encounter for other preprocedural examination: Secondary | ICD-10-CM | POA: Insufficient documentation

## 2011-03-07 DIAGNOSIS — M1710 Unilateral primary osteoarthritis, unspecified knee: Secondary | ICD-10-CM | POA: Insufficient documentation

## 2011-03-07 DIAGNOSIS — R9431 Abnormal electrocardiogram [ECG] [EKG]: Secondary | ICD-10-CM | POA: Insufficient documentation

## 2011-03-07 DIAGNOSIS — Z01812 Encounter for preprocedural laboratory examination: Secondary | ICD-10-CM | POA: Insufficient documentation

## 2011-03-07 DIAGNOSIS — Z01811 Encounter for preprocedural respiratory examination: Secondary | ICD-10-CM

## 2011-03-07 DIAGNOSIS — Z01810 Encounter for preprocedural cardiovascular examination: Secondary | ICD-10-CM | POA: Insufficient documentation

## 2011-03-07 DIAGNOSIS — Z79899 Other long term (current) drug therapy: Secondary | ICD-10-CM | POA: Insufficient documentation

## 2011-03-07 DIAGNOSIS — I10 Essential (primary) hypertension: Secondary | ICD-10-CM | POA: Insufficient documentation

## 2011-03-07 LAB — COMPREHENSIVE METABOLIC PANEL
ALT: 19 U/L (ref 0–35)
AST: 20 U/L (ref 0–37)
Albumin: 4.2 g/dL (ref 3.5–5.2)
Alkaline Phosphatase: 54 U/L (ref 39–117)
BUN: 21 mg/dL (ref 6–23)
CO2: 29 mEq/L (ref 19–32)
Calcium: 10.1 mg/dL (ref 8.4–10.5)
Chloride: 100 mEq/L (ref 96–112)
Creatinine, Ser: 0.65 mg/dL (ref 0.50–1.10)
GFR calc Af Amer: >60 mL/min (ref >60)
GFR calc non Af Amer: >60 mL/min (ref >60)
Glucose, Bld: 110 mg/dL — ABNORMAL HIGH (ref 70–99)
Potassium: 4.3 mEq/L (ref 3.5–5.1)
Sodium: 135 mEq/L (ref 135–145)
Total Bilirubin: 0.3 mg/dL (ref 0.3–1.2)
Total Protein: 7.6 g/dL (ref 6.0–8.3)

## 2011-03-07 LAB — CBC
HCT: 39.3 % (ref 36.0–46.0)
Hemoglobin: 13.3 g/dL (ref 12.0–15.0)
MCH: 30.6 pg (ref 26.0–34.0)
MCHC: 33.8 g/dL (ref 30.0–36.0)
MCV: 90.6 fL (ref 78.0–100.0)
Platelets: 198 10*3/uL (ref 150–400)
RBC: 4.34 MIL/uL (ref 3.87–5.11)
RDW: 12.9 % (ref 11.5–15.5)
WBC: 5.5 10*3/uL (ref 4.0–10.5)

## 2011-03-07 LAB — URINALYSIS, ROUTINE W REFLEX MICROSCOPIC
Glucose, UA: NEGATIVE mg/dL
Hgb urine dipstick: NEGATIVE
Ketones, ur: NEGATIVE mg/dL
Leukocytes, UA: NEGATIVE
Specific Gravity, Urine: 1.016 (ref 1.005–1.030)
Urobilinogen, UA: 0.2 mg/dL (ref 0.0–1.0)

## 2011-03-07 LAB — PROTIME-INR: Prothrombin Time: 13.3 seconds (ref 11.6–15.2)

## 2011-03-07 LAB — APTT: aPTT: 29 seconds (ref 24–37)

## 2011-03-07 LAB — SURGICAL PCR SCREEN
MRSA, PCR: NEGATIVE
Staphylococcus aureus: NEGATIVE

## 2011-03-16 ENCOUNTER — Inpatient Hospital Stay (HOSPITAL_COMMUNITY)
Admission: RE | Admit: 2011-03-16 | Discharge: 2011-03-23 | DRG: 462 | Disposition: A | Discharge status: Home Health | Payer: Medicare Other | Source: Ambulatory Visit | Attending: Orthopedic Surgery | Admitting: Orthopedic Surgery

## 2011-03-16 DIAGNOSIS — E871 Hypo-osmolality and hyponatremia: Secondary | ICD-10-CM | POA: Diagnosis not present

## 2011-03-16 DIAGNOSIS — I10 Essential (primary) hypertension: Secondary | ICD-10-CM | POA: Diagnosis present

## 2011-03-16 DIAGNOSIS — G2581 Restless legs syndrome: Secondary | ICD-10-CM | POA: Diagnosis present

## 2011-03-16 DIAGNOSIS — M1710 Unilateral primary osteoarthritis, unspecified knee: Principal | ICD-10-CM | POA: Diagnosis present

## 2011-03-16 DIAGNOSIS — D62 Acute posthemorrhagic anemia: Secondary | ICD-10-CM | POA: Diagnosis not present

## 2011-03-16 DIAGNOSIS — Z853 Personal history of malignant neoplasm of breast: Secondary | ICD-10-CM

## 2011-03-16 DIAGNOSIS — Z01812 Encounter for preprocedural laboratory examination: Secondary | ICD-10-CM

## 2011-03-16 LAB — GLUCOSE, CAPILLARY: Glucose-Capillary: 145 mg/dL — ABNORMAL HIGH (ref 70–99)

## 2011-03-16 LAB — ABO/RH: ABO/RH(D): O POS

## 2011-03-17 LAB — BASIC METABOLIC PANEL
BUN: 12 mg/dL (ref 6–23)
Calcium: 7.9 mg/dL — ABNORMAL LOW (ref 8.4–10.5)
Chloride: 102 mEq/L (ref 96–112)
Creatinine, Ser: 0.56 mg/dL (ref 0.50–1.10)
GFR calc Af Amer: >60 mL/min (ref >60)
GFR calc non Af Amer: >60 mL/min (ref >60)

## 2011-03-17 LAB — CBC
Hemoglobin: 8.4 g/dL — ABNORMAL LOW (ref 12.0–15.0)
MCH: 30.8 pg (ref 26.0–34.0)
MCHC: 33.6 g/dL (ref 30.0–36.0)
Platelets: 125 10*3/uL — ABNORMAL LOW (ref 150–400)
RDW: 13.1 % (ref 11.5–15.5)

## 2011-03-17 LAB — PROTIME-INR
INR: 1.21 (ref 0.00–1.49)
Prothrombin Time: 15.6 seconds — ABNORMAL HIGH (ref 11.6–15.2)

## 2011-03-17 LAB — PREPARE RBC (CROSSMATCH)

## 2011-03-18 LAB — CBC
MCH: 31.3 pg (ref 26.0–34.0)
MCHC: 35.2 g/dL (ref 30.0–36.0)
RDW: 13.2 % (ref 11.5–15.5)

## 2011-03-18 LAB — TYPE AND SCREEN
ABO/RH(D): O POS
Unit division: 0

## 2011-03-18 LAB — BASIC METABOLIC PANEL
BUN: 7 mg/dL (ref 6–23)
Calcium: 8.5 mg/dL (ref 8.4–10.5)
Chloride: 97 mEq/L (ref 96–112)
Creatinine, Ser: 0.52 mg/dL (ref 0.50–1.10)
GFR calc Af Amer: >60 mL/min (ref >60)

## 2011-03-18 LAB — PROTIME-INR
INR: 1.23 (ref 0.00–1.49)
Prothrombin Time: 15.8 seconds — ABNORMAL HIGH (ref 11.6–15.2)

## 2011-03-18 NOTE — Op Note (Signed)
Miranda Brady, BERT NO.:  [id number]  MEDICAL RECORD NO.:  [id number]  LOCATION:  0006                         FACILITY:  [facility]  PHYSICIAN:  Ollen Gross, M.D.    DATE OF BIRTH:  03-27-1941  DATE OF PROCEDURE:  03/16/2011 DATE OF DISCHARGE:                              OPERATIVE REPORT   PREOPERATIVE DIAGNOSIS:  Osteoarthritis, bilateral knees.  POSTOPERATIVE DIAGNOSIS:  Osteoarthritis, bilateral knees.  PROCEDURE:  Bilateral total knee arthroplasty.  SURGEON:  Ollen Gross, M.D.  ASSISTANT:  Alexzandrew L. Perkins, P.A.C.  ANESTHESIA:  General and postop epidural.  ESTIMATED BLOOD LOSS:  Minimal.  DRAIN:  Reinfusion drain x1 on each side.  TOURNIQUET TIME:  Right 37 minutes at 300 mmHg, left 33 minutes at 300 mmHg.  COMPLICATIONS:  None.  CONDITION.:  Stable to recovery.  BRIEF CLINICAL NOTE:  Miranda Brady is a 70 year old female with severe end-stage arthritis of both knees with progressively worsening pain and dysfunction.  She has had long-term nonoperative management including multiple series of injections without any recent benefit.  She presents now for bilateral total knee arthroplasty.  PROCEDURE IN DETAIL:  After successful administration of epidural anesthetic, then general anesthetic was also administered.  The patient was placed supine on the operating room table and tourniquet placed high on both thighs.  Both lower extremities were prepped and draped in the usual sterile fashion.  Right lower extremity was dressed first as this was causing more tissues.  The right lower extremity was wrapped in Esmarch, knee flexed, tourniquet inflated to 300 mmHg.  Midline incision was made with 10 blade through subcutaneous tissue to the level of the extensor mechanism.  A fresh blade was used to make a medial parapatellar arthrotomy.  Soft tissue on the proximal medial tibia was subperiosteally elevated to the joint line with the knife and  into the semimembranosus bursa with a Cobb elevator.  Soft tissue laterally was elevated with attention being paid to avoiding the patellar tendon on tibial tubercle.  Patella was everted, knee flexed 90 degrees, and ACL and PCL removed.  Drill was used to create a starting hole in the distal femur and the canal was thoroughly irrigated.  The 5-degree right valgus alignment guide was placed.  Distal femoral cutting block was placed to remove 11 mm off the distal femur.  Distal femoral resection was made with an oscillating saw.  The tibia subluxed forward and the menisci removed.  The extramedullary tibial alignment guide was placed referencing proximally at the medial aspect of the tibial tubercle and distally along the second metatarsal axis and tibial crest.  A block was pinned to remove 2 mm off the more deficient medial side.  Tibial resection was made with an oscillating saw.  Size 3 was the most appropriate tibial component.  Proximal tibia was prepared with a modular drill and keel punch for the size 3.  Femoral sizing guide was placed, size 4 was the most appropriate femur. It should be a 4 narrow as it was a 4 in the AP plane but 3 in the mediolateral plane.  The rotation was marked of the epicondylar axis and confirmed by creating rectangular flexion gap at  90 degrees.  The block was pinned in this rotation and then the anterior-posterior chamfer cuts were made.  The intercondylar block was placed and that cut was made. Trial size 4 narrow posterior stabilized femur was placed.  A 12.5-mm posterior stabilized rotating platform insert trial was placed.  With 12.5, full extension was achieved with excellent varus-valgus and anterior-posterior balance throughout full range of motion.  Patella was everted.  There were massive spurs around the patella.  These were all removed.  Thickness was measured to 22 mm.  Freehand resection taken to 12 mm, 35 template was placed, lug holes  were drilled, trial patella was placed and it tracks normally.  Osteophytes removed off the posterior femur with the trial in place.  All trials were removed and cut bone surfaces were prepared with pulsatile lavage.  Cement was mixed and once ready for implantation, the size 3 mobile bearing tibial tray, size 4 narrow posterior stabilized femur and 35 patella were cemented in place. The patella was held with a clamp.  Trial 12.5-mm insert was placed, knee held in full extension, all extruded cement removed.  When the cement had fully hardened, then the permanent 12.5-mm posterior stabilized rotating platform insert was placed in the tibial tray. Wound was copiously irrigated with saline solution and the arthrotomy closed over Hemovac drain with interrupted #1 PDS.  Flexion against gravity was 135 degrees and  patella tracks normally.  Subcu was then closed with interrupted 2-0 Vicryl, subcuticular running 4-0 Monocryl. Tourniquet was released after the closure of the extensor mechanism and the time was only 37 minutes.  A moist sponge and then a Esmarch was loosely wrapped around the incision.  The left lower extremity was then wrapped in Esmarch, knee flexed, tourniquet inflated to 300 mmHg.  Midline incision made with 10 blade through subcutaneous tissue to the extensor mechanism.  A fresh blade was used make a medial parapatellar arthrotomy.  Soft tissue releases were performed.  Knee flexed 90 degrees, patella everted and ACL and PCL removed.  Drill was used create a starting hole in the distal femur and canal was thoroughly irrigated.  A 5 degrees left valgus alignment guide WAS placed.  Distal femoral cutting block was pinned to remove 10 mm off the distal femur.  Resection was made with an oscillating saw.  The tibia subluxed forward and the menisci removed.  Extramedullary tibial alignment guide was placed referencing proximally at the medial aspect of the tibial tubercle and  distally along the second metatarsal axis tibial crest.  The block was pinned to remove 2 mm off the more deficient medial side.  Tibial resection was made with an oscillating saw.  Size 3 was the most appropriate tibial component.  The proximal tibia was prepared with modular drill and keel punch for the size 3.  Femoral sizing guide was placed, size 4 narrow was again most appropriate.  Rotation was marked at the epicondylar axis and confirmed by creating rectangular flexion gap at 90 degrees.  Block was pinned in this rotation and the anterior-posterior chamfer cuts were made. Intercondylar block was placed and that cut was made.  Trial size 4 narrow posterior stabilized femur was placed.  A 10-mm posterior stabilized rotating platform insert trial was placed.  With the 10, full extension was achieved with excellent varus-valgus and anterior- posterior balance throughout full range of motion.  Patella was everted, thickness measured to be 22 mm.  Freehand resection taken to 12 mm, 35 template placed, lug holes  were drilled, trial patella placed and tracks normally.  Osteophytes removed off posterior femur with the trial in place.  All trials were removed and the cut bone surfaces prepared with pulsatile lavage.  Cement was mixed and once ready for implantation, the size 3 mobile bearing tibial tray, size 4 narrow posterior stabilized femur and 35 patella were cemented in place.  Patella was held with a clamp.  Trial 10-mm insert was placed, knee held in full extension, all extruded cement removed.  When the cement had fully hardened, then the permanent 10-mm posterior stabilized rotating platform insert was placed in tibial tray.  Wound was copiously irrigated with saline solution and the arthrotomy closed over Hemovac drain with interrupted #1 PDS. Flexion against gravity to 135 degrees.  Patella tracks normally. Tourniquet was released after total time of 33 minutes.  Subcu  was closed with interrupted 2-0 Vicryl, subcuticular running 4-0 Monocryl. Incisions on both knees were cleaned and dried and Steri-Strips and bulky sterile dressings were applied.  She was then placed into knee immobilizers, awakened and transferred to recovery in stable condition.     Ollen Gross, M.D.     FA/MEDQ  D:  03/16/2011  T:  03/16/2011  Job:  [id number]  Electronically Signed by Ollen Gross M.D. on 03/18/2011 06:06:14 PM

## 2011-03-18 NOTE — H&P (Signed)
NAMECELA, NEWCOM NO.:  [id number]  MEDICAL RECORD NO.:  [id number]  LOCATION:                                 FACILITY:  PHYSICIAN:  Ollen Gross, M.D.    DATE OF BIRTH:  1941/01/24  DATE OF ADMISSION:  03/16/2011 DATE OF DISCHARGE:                             HISTORY & PHYSICAL   CHIEF COMPLAINT:  Bilateral knee pain.  HISTORY OF PRESENT ILLNESS:  The patient is a 70 year old female who has been seen by Dr. Lequita Halt for ongoing bilateral knee pain.  She has been seen in the office for both of her knees by Dr. Lequita Halt.  X-rays showed advanced arthritis in both knees.  She has been treated conservatively in the past for her arthritis including injections, unfortunately continues to have pain.  It is felt she would benefit from undergoing surgical intervention.  Risks and benefits have been discussed and she elected to proceed with surgery.  ALLERGIES:  No known drug allergies.  INTOLERANCES:  CHOLESTEROL DRUGS.  CURRENT MEDICATIONS:  Hydrochlorothiazide, tamoxifen, amlodipine, metoprolol, ropinirole, tramadol, Tandem, iron, Advil, Prilosec, vitamin D.  PAST MEDICAL HISTORY: 1. Recent bout of bronchitis. 2. Hypertension. 3. Hypercholesterolemia. 4. Iron deficiency anemia. 5. Left-sided breast cancer, treated with surgery and radiation. 6. Osteoarthritis. 7. Postmenopausal. 8. Childhood illnesses of measles and mumps.  PAST SURGICAL HISTORY:  Lumpectomy x2, knee arthroscopy.  FAMILY HISTORY:  Father deceased at age 68 with heart disease.  Mother age 32 with hyperparathyroidism.  SOCIAL HISTORY:  Married, professor.  Past smoker, 1-2 glasses of wine daily.  She does have a caregiver lined up.  She has 2 steps entering her home.  REVIEW OF SYSTEMS:  GENERAL:  No fever, chills, or night sweats. NEUROLOGIC:  No seizures, syncope, or paralysis.  RESPIRATORY:  No shortness of breath, productive cough, or hemoptysis.  CARDIOVASCULAR: No chest  pain, orthopnea.  GI:  No nausea, vomiting, diarrhea, or constipation.  GU:  No dysuria, hematuria, or discharge. MUSCULOSKELETAL:  Knee pain.  PHYSICAL EXAMINATION:  VITAL SIGNS:  Pulse 64, respirations 12, blood pressure 148/78. GENERAL:  A 70 year old white female well nourished, well developed, no acute distress.  She is alert, oriented, cooperative, pleasant.  Good historian. HEENT:  Normocephalic, atraumatic.  Pupils round and reactive.  EOMs intact. NECK:  Supple. CHEST:  Clear. HEART:  Regular rate and rhythm without murmur. ABDOMEN:  Soft, nontender.  Bowel sounds present. RECTAL/BREAST/GENITALIA:  Not done, not pertinent to present illness. EXTREMITIES:  Right knee varus malalignment deformity.  Range of motion 5 to 115, moderate crepitus, tender more medial than lateral, no instability.  Left knee range of motion 5-120, moderate crepitus, no effusion, no instability.  IMPRESSION:  Osteoarthritis, bilateral knees.  PLAN:  The patient will be admitted to [facility] to undergo bilateral total knee replacement arthroplasties.  Surgery will be performed by Dr. Ollen Gross.     Alexzandrew L. Julien Girt, P.A.C.   ______________________________ Ollen Gross, M.D.    ALP/MEDQ  D:  03/13/2011  T:  03/14/2011  Job:  [id number]  cc:   Juline Patch, M.D. Fax: [phone]  Lowella Dell, M.D. Fax: [phone]  Electronically Signed by Patrica Duel P.A.C.  on 03/16/2011 10:45:04 AM Electronically Signed by Ollen Gross M.D. on 03/18/2011 06:06:12 PM

## 2011-03-19 LAB — CBC
HCT: 24.5 % — ABNORMAL LOW (ref 36.0–46.0)
MCHC: 34.7 g/dL (ref 30.0–36.0)
MCV: 89.1 fL (ref 78.0–100.0)
Platelets: 103 10*3/uL — ABNORMAL LOW (ref 150–400)
RDW: 12.9 % (ref 11.5–15.5)
WBC: 9.1 10*3/uL (ref 4.0–10.5)

## 2011-03-19 LAB — BASIC METABOLIC PANEL
CO2: 29 mEq/L (ref 19–32)
Calcium: 8.1 mg/dL — ABNORMAL LOW (ref 8.4–10.5)
Creatinine, Ser: 0.51 mg/dL (ref 0.50–1.10)
GFR calc non Af Amer: >60 mL/min (ref >60)
Glucose, Bld: 136 mg/dL — ABNORMAL HIGH (ref 70–99)
Sodium: 132 mEq/L — ABNORMAL LOW (ref 135–145)

## 2011-03-19 LAB — PROTIME-INR: INR: 1.31 (ref 0.00–1.49)

## 2011-03-20 LAB — CBC
HCT: 23.3 % — ABNORMAL LOW (ref 36.0–46.0)
Hemoglobin: 8 g/dL — ABNORMAL LOW (ref 12.0–15.0)
MCH: 30.4 pg (ref 26.0–34.0)
MCHC: 34.3 g/dL (ref 30.0–36.0)
RDW: 12.8 % (ref 11.5–15.5)

## 2011-03-20 LAB — PROTIME-INR: INR: 1.56 — ABNORMAL HIGH (ref 0.00–1.49)

## 2011-03-21 LAB — BASIC METABOLIC PANEL
BUN: 12 mg/dL (ref 6–23)
Calcium: 8 mg/dL — ABNORMAL LOW (ref 8.4–10.5)
Creatinine, Ser: <0.47 mg/dL — ABNORMAL LOW (ref 0.50–1.10)
Glucose, Bld: 128 mg/dL — ABNORMAL HIGH (ref 70–99)

## 2011-03-21 LAB — PROTIME-INR: Prothrombin Time: 24.7 seconds — ABNORMAL HIGH (ref 11.6–15.2)

## 2011-03-21 LAB — CBC
Hemoglobin: 7.6 g/dL — ABNORMAL LOW (ref 12.0–15.0)
MCH: 30.4 pg (ref 26.0–34.0)
MCH: 31.3 pg (ref 26.0–34.0)
MCHC: 34.1 g/dL (ref 30.0–36.0)
MCHC: 35.5 g/dL (ref 30.0–36.0)
MCV: 89 fL (ref 78.0–100.0)
Platelets: 162 10*3/uL (ref 150–400)
Platelets: 155 10*3/uL (ref 150–400)
RDW: 12.9 % (ref 11.5–15.5)
RDW: 12.9 % (ref 11.5–15.5)

## 2011-03-22 LAB — CBC
HCT: 27.4 % — ABNORMAL LOW (ref 36.0–46.0)
MCHC: 33.9 g/dL (ref 30.0–36.0)
MCV: 87.5 fL (ref 78.0–100.0)
RDW: 13.6 % (ref 11.5–15.5)

## 2011-03-22 LAB — PROTIME-INR: INR: 2.53 — ABNORMAL HIGH (ref 0.00–1.49)

## 2011-03-25 LAB — CROSSMATCH
ABO/RH(D): O POS
Unit division: 0

## 2011-04-11 NOTE — Discharge Summary (Signed)
NAMEALLEAN, MONTFORT NO.:  [id number]  MEDICAL RECORD NO.:  [id number]  LOCATION:  1606                         FACILITY:  [facility]  PHYSICIAN:  Ollen Gross, M.D.    DATE OF BIRTH:  [birth year]  DATE OF ADMISSION:  03/16/2011 DATE OF DISCHARGE:  03/23/2011                              DISCHARGE SUMMARY   ADMITTING DIAGNOSES: 1. Osteoarthritis, bilateral knees. 2. Recent bout of bronchitis. 3. Hypertension. 4. Hypercholesterolemia. 5. Iron deficiency anemia. 6. Left-sided breast cancer. 7. Osteoarthritis. 8. Postmenopausal. 9. Childhood illnesses of measles and mumps.  DISCHARGE DIAGNOSES: 1. Osteoarthritis, bilateral knees, status post bilateral total knee     replacement arthroplasty. 2. Postop acute blood loss anemia. 3. Status post transfusion without sequelae. 4. Postop hyponatremia, improving. 5. Mild postop hypokalemia. 6. Recent bout of bronchitis. 7. Hypertension. 8. Hypercholesterolemia. 9. Iron deficiency anemia. 10.Left-sided breast cancer. 11.Osteoarthritis. 12.Postmenopausal. 13.Childhood illnesses of measles and mumps.  PROCEDURE:  March 16, 2011, bilateral total knee arthroplasty.  SURGEON:  Ollen Gross, M.D.  ASSISTANT:  Alexzandrew L. Perkins, P.A.C.  ANESTHESIA:  General with postop epidural.  TOURNIQUET TIME:  On the right knee was 37.  On the left knee was 33.  CONSULTS:  Anesthesia Department for postop epidural management.  BRIEF HISTORY:  The patient is a 70 year old female with severe end- stage arthritis of both knees, progressive worsening pain and dysfunction, nonoperative management including multiple series of injections without benefit, now presents for total knee arthroplasty.  LABORATORY DATA:  Preop CBC was not scanned into the chart, but a postop hemoglobin on day #1 was 8.4, given 2 units back up to 10.3, drifted back down to 7.2, given another couple of units, hemoglobin back up to 9.3.  Last done  hemoglobin 9.3 and hematocrit 27.4.  Serial prothrombin times followed per Coumadin protocol.  Last done PT/INR 28.6/2.64.  Chem panel on admission is not found in the chart, not available.  Serial BMETs were followed.  Sodium did drop now 134 to 130 back up to 132, potassium did drop from 4.3 to 3.5, last noted at 3.4.  Blood group type O positive.  HOSPITAL COURSE:  The patient was admitted to [facility], taken to OR, underwent the above-stated procedure without complication. The patient tolerated the procedure well, later transferred to recovery room and then to the orthopedic floor.  She had autoinfusion drains placed at the time of surgery, and she did receive blood back from the drains evening of surgery.  She was doing pretty well on the morning of day #1, but her pressure was very low and it was felt to be due to the epidural which she had placed at the time of surgery from anesthesia standpoint.  Hemoglobin was low down to 8.4 on postop day #1 and it was felt she would need blood with a combination of low pressure, low hemoglobin, she was given 2 units of blood.  She was seen by Anesthesia a little bit later and she had some blood draining from the epidural site and felt to be intravascular, so the epidural catheter was removed on day #1.  She continued on the PCA and IV analgesics.  She has had  moderate pain after the epidural came out.  On day #2, she was getting up but had increased pain, encouraged her to use the p.o. and IV analgesics.  Both dressings were changed at this time.  Incision looked good.  By day #3, she was doing a little bit better.  Foley was removed on day #3 once she was up moving a little bit better.  She encouraged incentive spirometer.  She had a little bit of temperature on the evening of day #2, morning of day #3 of 102, but it was back down to 100.  Encouraged incentive spirometer while being in the bed.  By day #4, she was doing a little  bit better.  Hemoglobin was back down to 8, she was asymptomatic with this.  Sodium had dropped down to 132.  By day #5, she was wanting go home, but she had not quite met all of her goals. We  arranged for home health then she needed another day, so she remained in the hospital until day #6.  Once she had met her goals, doing better, she was discharged home later that day.  DISCHARGE PLAN: 1. The patient was discharged home on March 23, 2011. 2. Discharge diagnoses, please see above. 3. Discharge medications, Robaxin, OxyIR, Coumadin.  Continue     amlodipine, Benadryl, hydrochlorothiazide, hydrocortisone,     metoprolol, Prilosec, and ropinirole.  DIET:  Heart-healthy diet.  ACTIVITY:  Weightbearing as tolerated bilateral lower extremities.  FOLLOWUP:  In 2 weeks.  DISPOSITION:  Home.  CONDITION ON DISCHARGE:  Improving.     Alexzandrew L. Julien Girt, P.A.C.   ______________________________ Ollen Gross, M.D.    ALP/MEDQ  D:  04/07/2011  T:  04/08/2011  Job:  [id number]  Electronically Signed by Ollen Gross M.D. on 04/11/2011 11:35:39 AM

## 2011-07-05 ENCOUNTER — Other Ambulatory Visit: Payer: Medicare Other

## 2011-07-12 ENCOUNTER — Ambulatory Visit: Payer: Medicare Other | Admitting: Oncology

## 2011-07-21 ENCOUNTER — Other Ambulatory Visit: Payer: Self-pay | Admitting: Oncology

## 2011-07-21 DIAGNOSIS — C50919 Malignant neoplasm of unspecified site of unspecified female breast: Secondary | ICD-10-CM

## 2011-07-22 ENCOUNTER — Telehealth: Payer: Self-pay | Admitting: *Deleted

## 2011-07-22 NOTE — Telephone Encounter (Signed)
Mailed out calendar and letter inform the patient of the new date and time appointment in 08-2011 with Amy Allyson Sabal

## 2011-09-14 ENCOUNTER — Other Ambulatory Visit: Payer: Medicare Other | Admitting: Lab

## 2011-09-21 ENCOUNTER — Ambulatory Visit: Payer: Medicare Other | Admitting: Physician Assistant

## 2011-09-28 ENCOUNTER — Other Ambulatory Visit: Payer: Self-pay | Admitting: Physician Assistant

## 2011-09-28 DIAGNOSIS — C50919 Malignant neoplasm of unspecified site of unspecified female breast: Secondary | ICD-10-CM

## 2011-09-28 NOTE — Progress Notes (Signed)
[facility] on 09/21/2011.  Orders placed to reschedule lab and visit.

## 2011-10-05 ENCOUNTER — Telehealth: Payer: Self-pay | Admitting: Oncology

## 2011-10-05 ENCOUNTER — Ambulatory Visit: Payer: Medicare Other | Admitting: Physician Assistant

## 2011-10-05 ENCOUNTER — Other Ambulatory Visit: Payer: Medicare Other | Admitting: Lab

## 2011-10-05 ENCOUNTER — Ambulatory Visit: Payer: Medicare Other | Admitting: Oncology

## 2011-10-05 NOTE — Telephone Encounter (Signed)
S/w the pt and she is aware that her appts have been cancelled today and we will call her back to r/s

## 2011-10-06 ENCOUNTER — Telehealth: Payer: Self-pay | Admitting: Oncology

## 2011-10-06 NOTE — Telephone Encounter (Signed)
lmonvm adviisng the pt of her r/s feb appts that were cancelled .

## 2011-10-18 ENCOUNTER — Ambulatory Visit: Payer: Medicare Other | Admitting: Physician Assistant

## 2011-10-18 ENCOUNTER — Other Ambulatory Visit: Payer: Medicare Other | Admitting: Lab

## 2011-10-18 NOTE — Progress Notes (Signed)
[facility] today.  Letter mailed to patient requesting she contact us to reschedule.

## 2011-12-13 IMAGING — CR DG CHEST 2V
2 series · 2 of 2 positions shown · non-contrast
Comparison: 01/25/2007

CLINICAL DATA: Preop bilateral knee surgery

CHEST - 2 VIEW

[w chest pa]
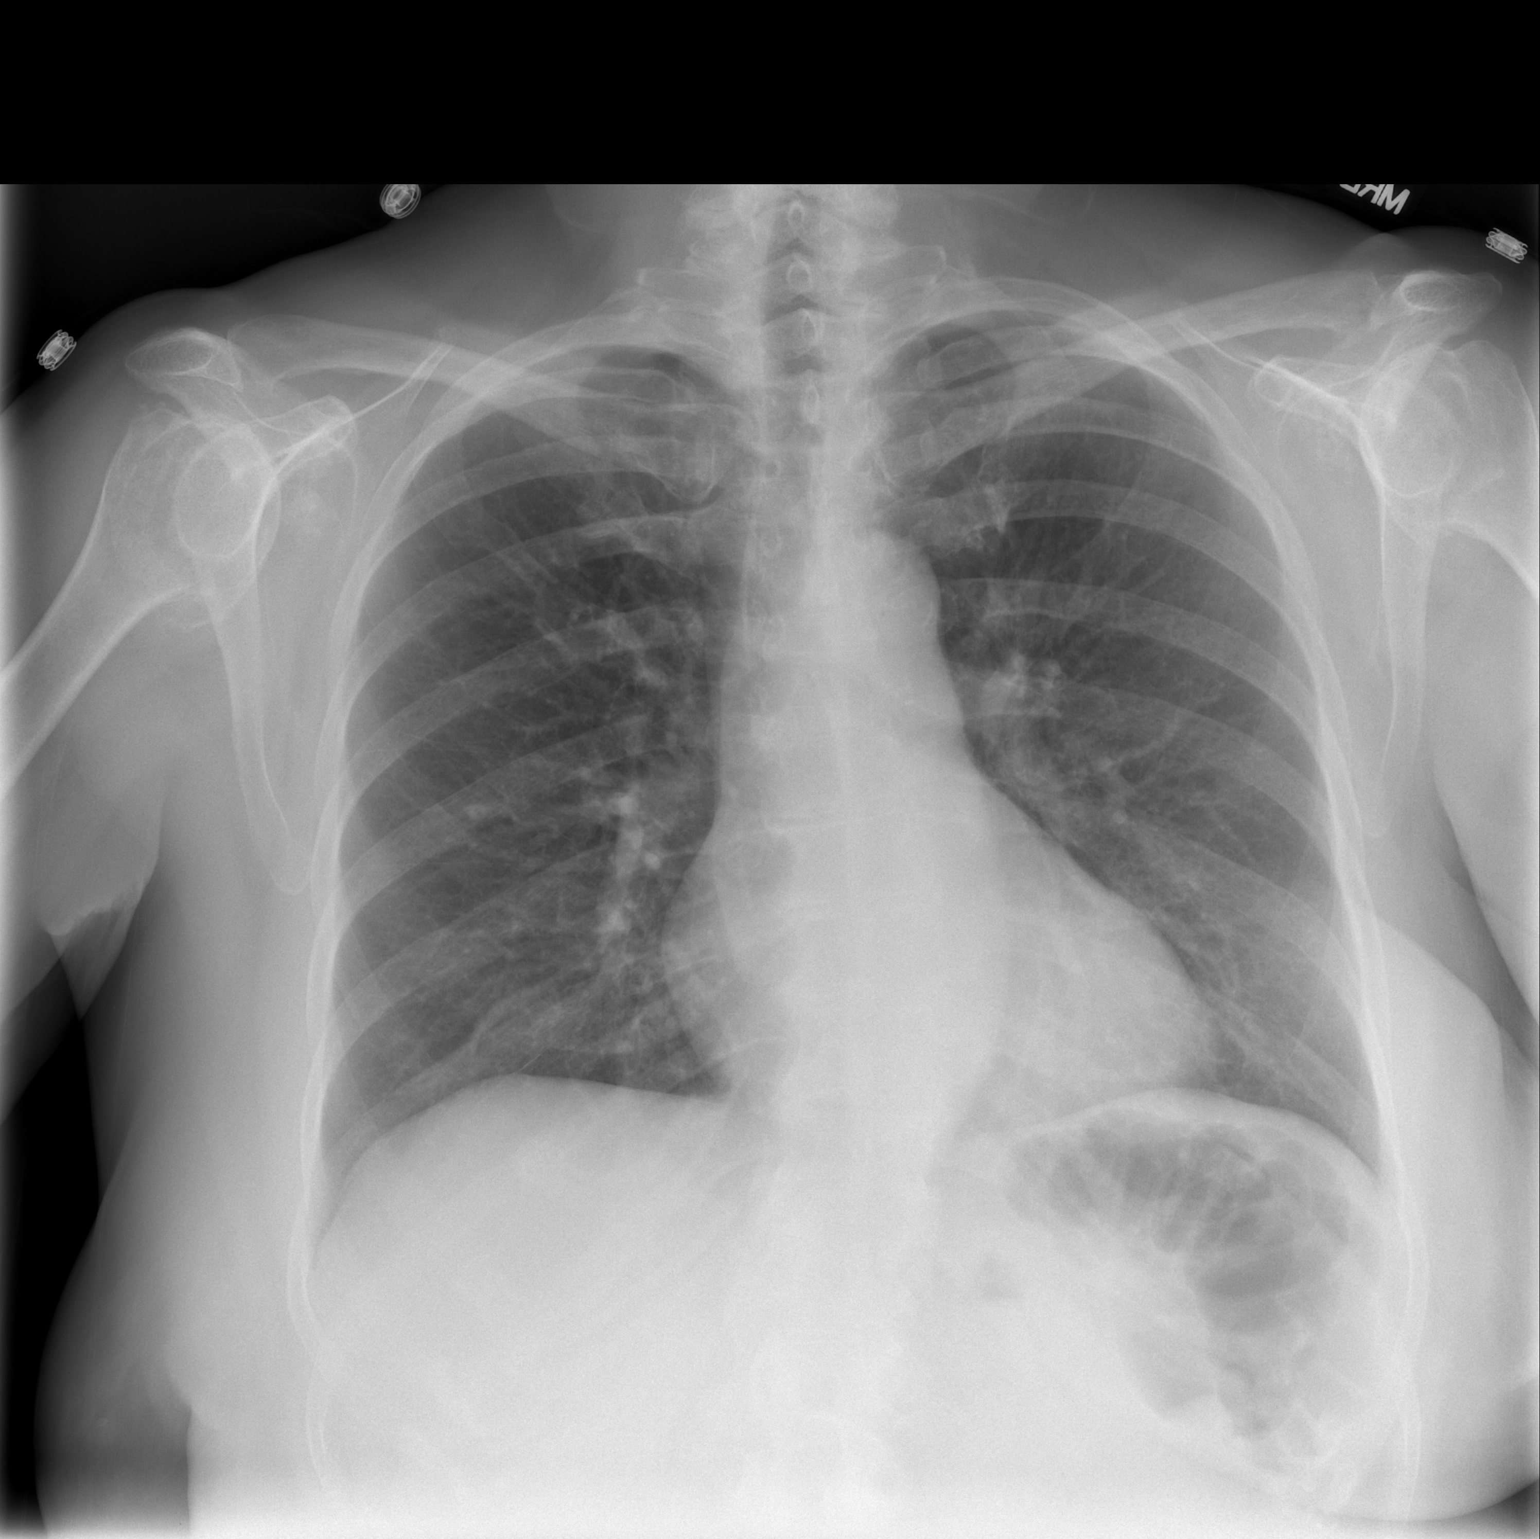

[w chest lat]
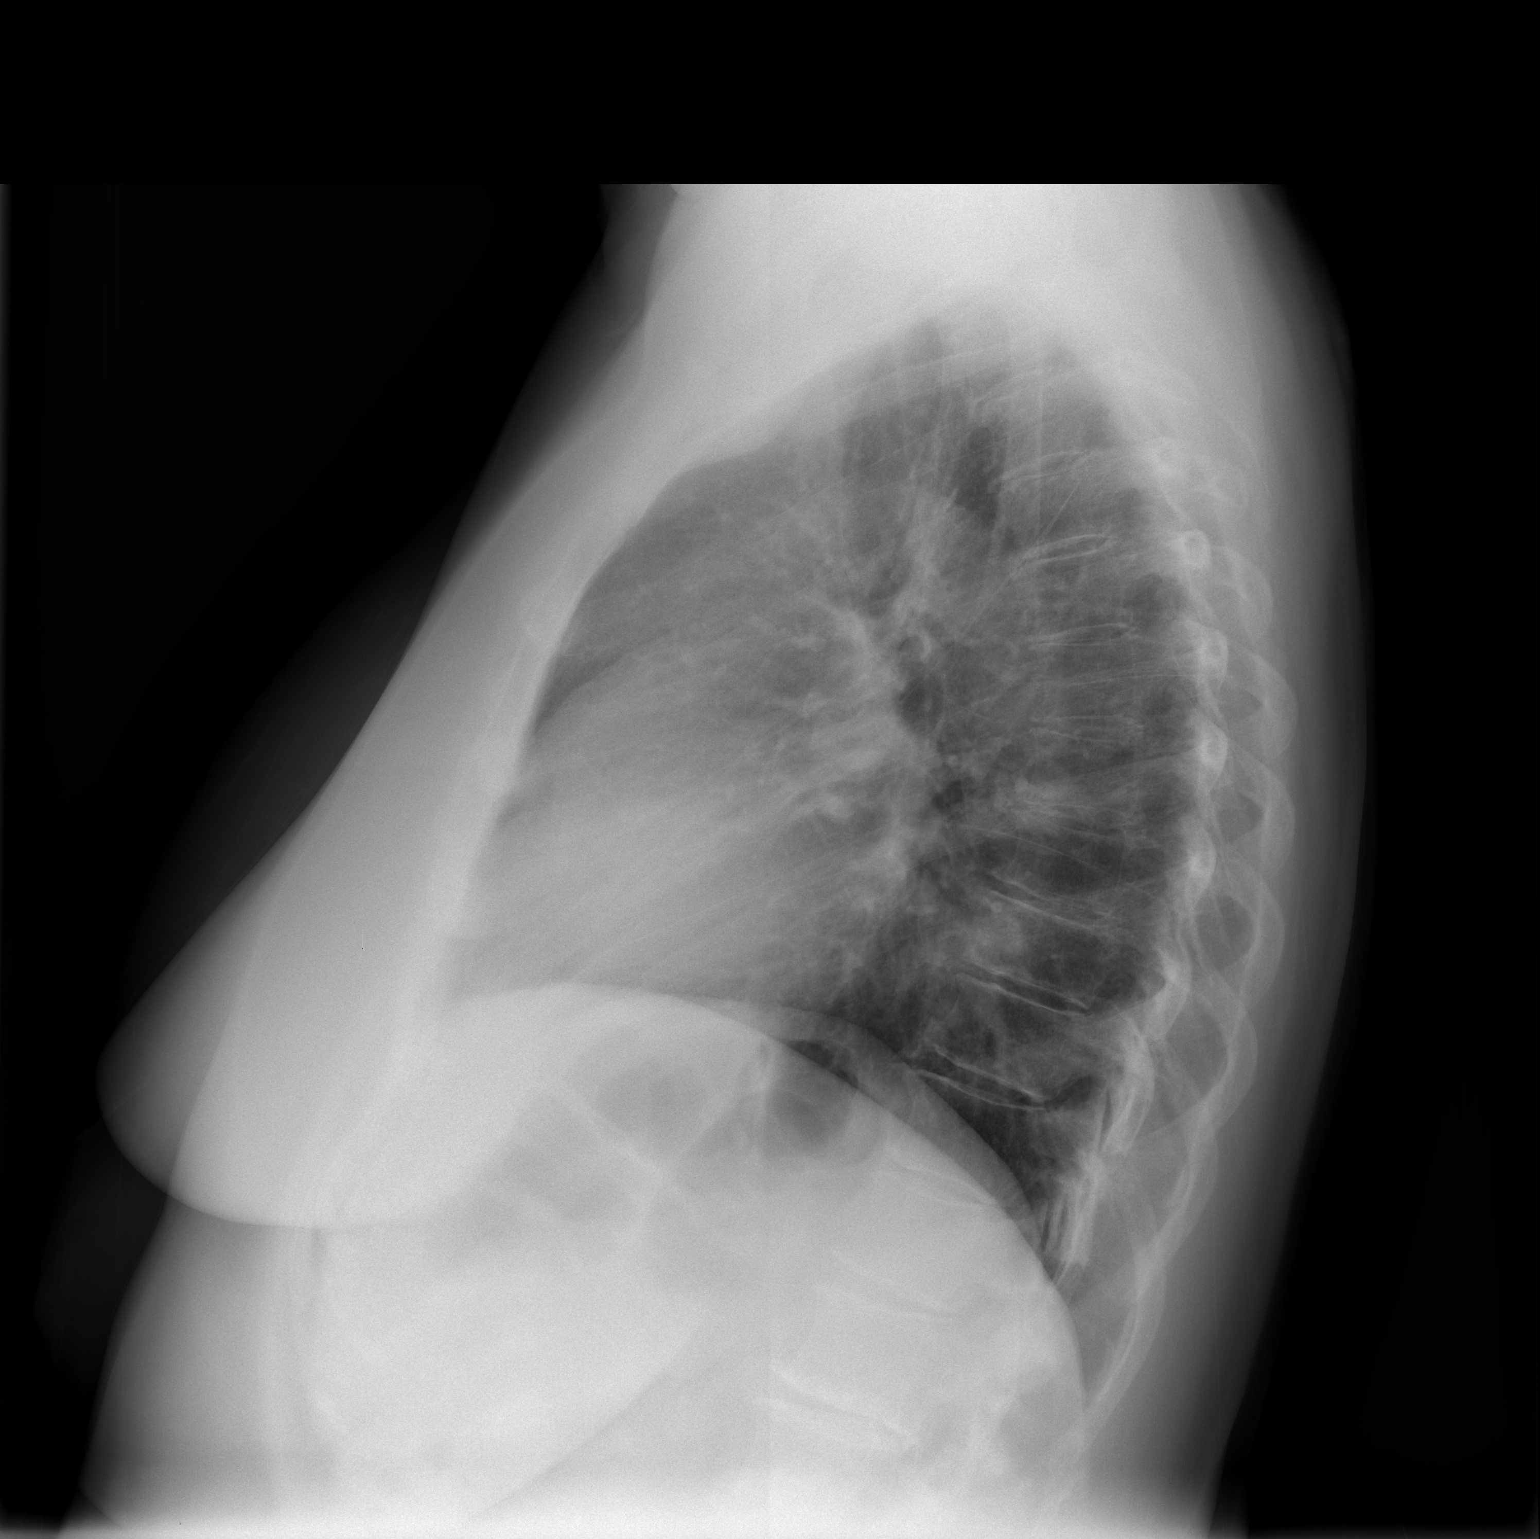

[2 of 2 positions shown; findings below may reference images not displayed]

FINDINGS: The heart size and mediastinal contours are within normal
limits.  Both lungs are clear. Advanced degenerative changes are
noted involving both glenohumeral joints.  Calcifications are noted
in both shoulders which may be loose bodies such as synovial
osteochondromatosis.
IMPRESSION: No active cardiopulmonary abnormalities.

## 2012-01-11 ENCOUNTER — Other Ambulatory Visit: Payer: Medicare Other | Admitting: Lab

## 2012-01-11 ENCOUNTER — Encounter: Payer: Self-pay | Admitting: Physician Assistant

## 2012-01-11 ENCOUNTER — Telehealth: Payer: Self-pay | Admitting: Oncology

## 2012-01-11 ENCOUNTER — Ambulatory Visit (HOSPITAL_BASED_OUTPATIENT_CLINIC_OR_DEPARTMENT_OTHER): Payer: Medicare Other | Admitting: Physician Assistant

## 2012-01-11 VITALS — BP 131/73 | HR 66 | Temp 98.4°F | Ht 67.0 in | Wt 203.9 lb

## 2012-01-11 DIAGNOSIS — N6320 Unspecified lump in the left breast, unspecified quadrant: Secondary | ICD-10-CM

## 2012-01-11 DIAGNOSIS — Z853 Personal history of malignant neoplasm of breast: Secondary | ICD-10-CM

## 2012-01-11 DIAGNOSIS — N63 Unspecified lump in unspecified breast: Secondary | ICD-10-CM

## 2012-01-11 DIAGNOSIS — C50919 Malignant neoplasm of unspecified site of unspecified female breast: Secondary | ICD-10-CM

## 2012-01-11 LAB — CBC WITH DIFFERENTIAL/PLATELET
Basophils Absolute: 0 10*3/uL (ref 0.0–0.1)
EOS%: 5.7 % (ref 0.0–7.0)
HCT: 37.9 % (ref 34.8–46.6)
HGB: 12.9 g/dL (ref 11.6–15.9)
LYMPH%: 21.5 % (ref 14.0–49.7)
MCH: 31 pg (ref 25.1–34.0)
MCV: 91.2 fL (ref 79.5–101.0)
MONO%: 8.8 % (ref 0.0–14.0)
NEUT%: 63.5 % (ref 38.4–76.8)

## 2012-01-11 LAB — COMPREHENSIVE METABOLIC PANEL
Albumin: 3.9 g/dL (ref 3.5–5.2)
Alkaline Phosphatase: 46 U/L (ref 39–117)
BUN: 18 mg/dL (ref 6–23)
Glucose, Bld: 115 mg/dL — ABNORMAL HIGH (ref 70–99)
Potassium: 4 mEq/L (ref 3.5–5.3)
Total Bilirubin: 0.4 mg/dL (ref 0.3–1.2)

## 2012-01-11 MED ORDER — TAMOXIFEN CITRATE 20 MG PO TABS: 20.0000 mg | ORAL_TABLET | Freq: Every day | ORAL | Status: AC

## 2012-01-11 NOTE — Progress Notes (Signed)
ID: Miranda Brady   DOB: 1941-01-31  MR#: [id number]  CSN#:[id number]  HISTORY OF PRESENT ILLNESS: She had a screening mammogram at [facility] (I do not have those results), and was referred to the [facility] for further evaluation.  On April 2, she underwent mag views of the left breast demonstrating two adjacent clusters of pleomorphic microcalcifications in the left upper outer quadrant.  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  The patient was referred to Dr. Luisa Hart, and on April 9 received bilateral breast MRIs which showed no areas of abnormal enhancement in either breast.  Accordingly, on June 3 the patient underwent needle localized resection of the left breast mass, and the final pathology ([id number] and [id number]) showed in addition to extensive intraductal carcinoma as noted before, a grade 1 invasive ductal carcinoma, which measured up to 6 mm.  Margins were very close for the in situ component.  There was no evidence of lymphovascular invasion, and on the invasive specimen, the estrogen receptor status was 100% positive, PR 48% positive, proliferation marker 20%, and Hercept test 0.  The patient then sought further clearance of the margins in [place] (where her daughter lives, and more importantly, where her grandson lives as well.  He is two and one-half years old and very good medicine I am sure).  Review of postoperative mammograms in [place] show no residual calcifications, and on July 30 the patient underwent re-excision of the original tumor site, superior margin, and sentinel lymph node biopsy of the left axilla under Dr. Larinda Buttery.  The pathology from [facility] [phone]) showed a 4 mm area of residual moderately differentiated invasive ductal carcinoma, with multiple foci of ductal  carcinoma in situ, two sentinel lymph nodes negative, and a third non-sentinel lymph node also negative (March 29, 2007).   The patient received radiation therapy which was completed in January 2009. That point she was started on tamoxifen, and continues at 20 mg daily with good tolerance.  INTERVAL HISTORY: [name] returns today for routine followup of her left breast carcinoma. Interval history is marked for her having a knee replacement in the last year. She continues to do a lot of traveling, recently traveling to the [place] on a river cruise and also taking a trip to [place].  [name] continues on tamoxifen which she is tolerating well. She has no hot flashes, no evidence of abnormal clotting. No change in vision. She also denies any vaginal dryness, discharge, or abnormal vaginal bleeding. She did have some bleeding recently with a urinary tract infection, but this completely cleared with antibiotics.    REVIEW OF SYSTEMS:  Otherwise Miranda Brady denies any fevers, chills, or night sweats. She's had no nausea or change in bowel habits. No chest pain or shortness of breath. No abnormal headaches or dizziness and no unusual myalgias, arthralgias, or bony pain.  A detailed review of systems is otherwise unremarkable.  PAST MEDICAL HISTORY: No past medical history on file. The past medical history is significant for osteoarthritis, particularly involving the knees, with knee replacements in 2012.  She has a history of hypertension, a history of glucose intolerance, a history of hypercholesterolemia status post tonsillectomy and adenoidectomy, and a history of anemia secondary to iron deficiency, felt to be secondary to nonsteroidal use due to her knee problems.  She underwent GI evaluation, both upper  and lower under Sabino Gasser within the last two years, with no evidence of cancer or a specific bleeding site found.   PAST SURGICAL HISTORY: No past surgical history on file.  FAMILY HISTORY No  family history on file. The patient's father died at the age of 2 from a myocardial infarction.  The patient's mother is alive at age 74.  The patient has one sister who was diagnosed with breast cancer in her late 71s, and died approximately ten years later.  There is no history of ovarian cancer in the family.   GYNECOLOGIC HISTORY:  She is GX P2.  First pregnancy age 56, change of life in her late 60s, and she took hormones for almost ten years, finally stopping about three-to-five years ago.   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  Her husband of 38 years, [name], is Physicist, medical at the [employer].  They have a daughter in [place] as already noted, who works as a Clinical research associate, and has a two and one-half year old son.  The patient's son lives in [place], is also a Clinical research associate, just starting off.  They are all [employer] graduates.  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  Note that [name] was brought up as [place] Orthodox (not [place] Orthodox).     ADVANCED DIRECTIVES:  HEALTH MAINTENANCE: History  Substance Use Topics  . Smoking status: Never Smoker   . Smokeless tobacco: Not on file  . Alcohol Use:      Colonoscopy:  PAP:  Bone density: Jan 2012, normal  Lipid panel:  Allergies  Allergen Reactions  . Statins     REACTION: Muscle Aches    Current Outpatient Prescriptions  Medication Sig Dispense Refill  . amLODipine (NORVASC) 5 MG tablet Take 5 mg by mouth daily.      Marland Kitchen azelastine (ASTELIN) 137 MCG/SPRAY nasal spray       . cholecalciferol (VITAMIN D) 400 UNITS TABS Take 2,000 Units by mouth daily.      . hydrochlorothiazide (HYDRODIURIL) 25 MG tablet       . metFORMIN (GLUCOPHAGE) 1000 MG tablet Take 1,000 mg by mouth daily with breakfast.      . metoprolol succinate (TOPROL-XL) 25 MG 24 hr tablet Take 25 mg by mouth daily.      Marland Kitchen rOPINIRole (REQUIP) 4 MG tablet Take 4 mg by mouth at  bedtime.      . tamoxifen (NOLVADEX) 20 MG tablet Take 1 tablet (20 mg total) by mouth daily.  90 tablet  3  . temazepam (RESTORIL) 15 MG capsule         OBJECTIVE: Filed Vitals:   01/11/12 1520  BP: 131/73  Pulse: 66  Temp: 98.4 F (36.9 C)     Body mass index is 31.94 kg/(m^2).    ECOG FS: 0  Filed Weights   01/11/12 1520  Weight: 203 lb 14.4 oz (92.488 kg)   Physical Exam: HEENT:  Sclerae anicteric, conjunctivae pink.  Oropharynx clear.   Nodes:  No cervical, supraclavicular, or axillary lymphadenopathy palpated.  Breast Exam:  Right breast is status post reduction mammoplasty with well-healed incision. No suspicious nodularity or skin changes. Left breast is status post lumpectomy there is some scar tissue, somewhat nodular to palpation, along the lumpectomy incision. No additional masses or skin changes suggestive of disease recurrence.   Lungs:  Clear to auscultation bilaterally.  No crackles, rhonchi, or wheezes.   Heart:  Regular rate and rhythm.   Abdomen:  Soft, nontender.  Positive bowel sounds.  No organomegaly or masses palpated.   Musculoskeletal:  No focal spinal tenderness to palpation.  Extremities:  Benign.  No peripheral edema or cyanosis.   Skin:  Benign.   Neuro:  Nonfocal.   LAB RESULTS: Lab Results  Component Value Date   WBC 5.6 01/11/2012   NEUTROABS 3.6 01/11/2012   HGB 12.9 01/11/2012   HCT 37.9 01/11/2012   MCV 91.2 01/11/2012   PLT 178 01/11/2012      Chemistry      Component Value Date/Time   NA 132* 03/21/2011 0435   K 3.4* 03/21/2011 0435   CL 96 03/21/2011 0435   CO2 29 03/21/2011 0435   BUN 12 03/21/2011 0435   CREATININE <0.47* 03/21/2011 0435      Component Value Date/Time   CALCIUM 8.0* 03/21/2011 0435   ALKPHOS 54 03/07/2011 1545   AST 20 03/07/2011 1545   ALT 19 03/07/2011 1545   BILITOT 0.3 03/07/2011 1545       Lab Results  Component Value Date   LABCA2 16 07/06/2010   CMET and CA27.29  were both repeated today, results  pending.   STUDIES: Most recent bilateral mammogram at [facility] in June 2012 is unremarkable.  Most recent bone density at [facility] in January 2012 was normal.  ASSESSMENT: A 71 year old [place] woman status post left lumpectomy June of 2008 for a T1b N0 grade 1 invasive ductal carcinoma, ER+, HER2-, status post radiation completed in January of 2009, at which point she was started on tamoxifen.  PLAN: With regards to her breast cancer, Lanna appears to be doing well it is time for her annual mammogram in am requesting an ultrasound of the left breast to evaluate the thickening in the area of the left lumpectomy incision. I believe this is simply going to be scar tissue, but it is a little nodular on palpation.  Otherwise, Aishani  will continue on tamoxifen which I have refilled today. . The plan is to complete tamoxifen in January 2014, and she will see Dr. Darnelle Catalan at that time, then likely be released from followup.   She voices understanding and agreement with our plan and will call with any changes or problems.  Elie Leppo    01/11/2012

## 2012-01-11 NOTE — Telephone Encounter (Signed)
gve the pt her jan 2014 appt calendar. Per pt she will call to schedule her own mammo/us appt

## 2012-01-12 ENCOUNTER — Other Ambulatory Visit: Payer: Self-pay | Admitting: Oncology

## 2012-09-02 ENCOUNTER — Telehealth: Payer: Self-pay | Admitting: *Deleted

## 2012-09-02 NOTE — Telephone Encounter (Signed)
Left message for pt to return my call so I can let her know that we had to change her time to come in on 1/21.

## 2012-09-03 ENCOUNTER — Telehealth: Payer: Self-pay | Admitting: *Deleted

## 2012-09-03 NOTE — Telephone Encounter (Signed)
Patient called stating that she is going to be out of town and cannot make her appts scheduled.  Rescheduled and confirmed new lab & f/u appt w/ pt.

## 2012-09-04 ENCOUNTER — Other Ambulatory Visit (HOSPITAL_BASED_OUTPATIENT_CLINIC_OR_DEPARTMENT_OTHER): Payer: Medicare Other | Admitting: Lab

## 2012-09-04 DIAGNOSIS — C50919 Malignant neoplasm of unspecified site of unspecified female breast: Secondary | ICD-10-CM

## 2012-09-04 LAB — COMPREHENSIVE METABOLIC PANEL (CC13)
AST: 17 U/L (ref 5–34)
Albumin: 4 g/dL (ref 3.5–5.0)
Alkaline Phosphatase: 67 U/L (ref 40–150)
Calcium: 8.7 mg/dL (ref 8.4–10.4)
Chloride: 106 mEq/L (ref 98–107)
Glucose: 147 mg/dl — ABNORMAL HIGH (ref 70–99)
Potassium: 3.7 mEq/L (ref 3.5–5.1)
Sodium: 139 mEq/L (ref 136–145)
Total Protein: 7.4 g/dL (ref 6.4–8.3)

## 2012-09-04 LAB — CBC WITH DIFFERENTIAL/PLATELET
BASO%: 0.8 % (ref 0.0–2.0)
Basophils Absolute: 0 10*3/uL (ref 0.0–0.1)
EOS%: 6.5 % (ref 0.0–7.0)
HCT: 40.3 % (ref 34.8–46.6)
HGB: 13.8 g/dL (ref 11.6–15.9)
MCH: 31.5 pg (ref 25.1–34.0)
MONO#: 0.5 10*3/uL (ref 0.1–0.9)
NEUT%: 57 % (ref 38.4–76.8)
RDW: 13.1 % (ref 11.2–14.5)
WBC: 5.8 10*3/uL (ref 3.9–10.3)
lymph#: 1.6 10*3/uL (ref 0.9–3.3)

## 2012-09-11 ENCOUNTER — Ambulatory Visit: Payer: Medicare Other | Admitting: Oncology

## 2012-09-11 ENCOUNTER — Other Ambulatory Visit: Payer: Medicare Other | Admitting: Lab

## 2012-09-18 ENCOUNTER — Ambulatory Visit: Payer: Medicare Other | Admitting: Oncology

## 2012-09-19 ENCOUNTER — Ambulatory Visit (HOSPITAL_BASED_OUTPATIENT_CLINIC_OR_DEPARTMENT_OTHER): Payer: Medicare Other | Admitting: Oncology

## 2012-09-19 VITALS — BP 152/88 | HR 70 | Temp 98.2°F | Resp 20 | Ht 67.0 in | Wt 218.0 lb

## 2012-09-19 DIAGNOSIS — Z853 Personal history of malignant neoplasm of breast: Secondary | ICD-10-CM

## 2012-09-19 DIAGNOSIS — C50919 Malignant neoplasm of unspecified site of unspecified female breast: Secondary | ICD-10-CM

## 2012-09-19 NOTE — Progress Notes (Signed)
ID: Miranda Brady   DOB: Mar 12, 1941  MR#: [id number]  WUJ#:[id number]  PCP: Juline Patch, MD   HISTORY OF PRESENT ILLNESS: She had a screening mammogram at [facility] (I do not have those results), and was referred to the [facility] for further evaluation.  On April 2, she underwent mag views of the left breast demonstrating two adjacent clusters of pleomorphic microcalcifications in the left upper outer quadrant.  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  The patient was referred to Dr. Luisa Hart, and on April 9 received bilateral breast MRIs which showed no areas of abnormal enhancement in either breast.  Accordingly, on June 3 the patient underwent needle localized resection of the left breast mass, and the final pathology ([id number] and [id number]) showed in addition to extensive intraductal carcinoma as noted before, a grade 1 invasive ductal carcinoma, which measured up to 6 mm.  Margins were very close for the in situ component.  There was no evidence of lymphovascular invasion, and on the invasive specimen, the estrogen receptor status was 100% positive, PR 48% positive, proliferation marker 20%, and Hercept test 0.  The patient then sought further clearance of the margins in [place] (where her daughter lives, and more importantly, where her grandson lives as well.  He is two and one-half years old and very good medicine I am sure).  Review of postoperative mammograms in [place] show no residual calcifications, and on July 30 the patient underwent re-excision of the original tumor site, superior margin, and sentinel lymph node biopsy of the left axilla under Dr. Larinda Buttery.  The pathology from [facility] [phone]) showed a 4 mm area of residual moderately differentiated invasive ductal carcinoma, with  multiple foci of ductal carcinoma in situ, two sentinel lymph nodes negative, and a third non-sentinel lymph node also negative (March 29, 2007).   The patient received radiation therapy which was completed in January 2009. That point she was started on tamoxifen, and continues at 20 mg daily with good tolerance.  INTERVAL HISTORY: Miranda Brady returns today for routine followup of her left breast carcinoma. The interval history is entirely unremarkable. She is doing a lot of traveling, has 2 new grandchildren, and is exercising on a regular basis.   REVIEW OF SYSTEMS: She is taking tamoxifen with no side effects that she is aware of. She sleeps only 4-6 hours a night, but doesn't feel tired. She tells me her diabetes is well-controlled. A detailed review of systems today was otherwise entirely negative.  PAST MEDICAL HISTORY: No past medical history on file. The past medical history is significant for osteoarthritis, particularly involving the knees, with knee replacements in 2012.  She has a history of hypertension, a history of glucose intolerance, a history of hypercholesterolemia status post tonsillectomy and adenoidectomy, and a history of anemia secondary to iron deficiency, felt to be secondary to nonsteroidal use due to her knee problems.  She underwent GI evaluation, both upper and lower under Sabino Gasser within the last two years, with no evidence of cancer or a specific bleeding site found.   PAST SURGICAL HISTORY: No past surgical history on file.  FAMILY HISTORY No family history on file. The patient's father died at the age of 10 from a myocardial infarction.  The patient's mother is alive at age 61.  The patient has one sister who was  diagnosed with breast cancer in her late 2s, and died approximately ten years later.  There is no history of ovarian cancer in the family.   GYNECOLOGIC HISTORY:  She is GX P2.  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  Her husband  [name], is Physicist, medical at the [employer].  They have a daughter in [place], who works as a Clinical research associate, and has a two and one-half year old son.  The patient's son lives in [place], is also a Clinical research associate.  They are all [employer] graduates.  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  Note that [name] was brought up as [place] Orthodox (not [place] Orthodox).     ADVANCED DIRECTIVES: not in place  HEALTH MAINTENANCE: History  Substance Use Topics  . Smoking status: Never Smoker   . Smokeless tobacco: Not on file  . Alcohol Use:      Colonoscopy:  PAP:  Bone density: Jan 2012, normal  Lipid panel:  Allergies  Allergen Reactions  . Statins     REACTION: Muscle Aches    Current Outpatient Prescriptions  Medication Sig Dispense Refill  . amLODipine (NORVASC) 5 MG tablet Take 5 mg by mouth daily.      Marland Kitchen azelastine (ASTELIN) 137 MCG/SPRAY nasal spray       . cholecalciferol (VITAMIN D) 400 UNITS TABS Take 2,000 Units by mouth daily.      . hydrochlorothiazide (HYDRODIURIL) 25 MG tablet       . metFORMIN (GLUCOPHAGE) 1000 MG tablet Take 1,000 mg by mouth daily with breakfast.      . metoprolol succinate (TOPROL-XL) 25 MG 24 hr tablet Take 25 mg by mouth daily.      Marland Kitchen rOPINIRole (REQUIP) 4 MG tablet Take 4 mg by mouth at bedtime.      . tamoxifen (NOLVADEX) 20 MG tablet Take 1 tablet (20 mg total) by mouth daily.  90 tablet  3  . temazepam (RESTORIL) 15 MG capsule         OBJECTIVE: Middle-aged white woman in no acute distress Filed Vitals:   09/19/12 1307  BP: 152/88  Pulse: 70  Temp: 98.2 F (36.8 C)  Resp: 20     Body mass index is 34.14 kg/(m^2).    ECOG FS: 0  Filed Weights   09/19/12 1307  Weight: 218 lb (98.884 kg)   Physical Exam: HEENT:  Sclerae anicteric, conjunctivae  pink.  Oropharynx clear.   Nodes:  No cervical, supraclavicular, or axillary lymphadenopathy palpated.  Breast Exam:  Right breast is status post reduction mammoplasty with well-healed incision. Left breast is status post lumpectomy. There is no evidence of disease recurrence. The left axilla is benign.  Lungs:  Clear to auscultation bilaterally.  No crackles, rhonchi, or wheezes.   Heart:  Regular rate and rhythm.   Abdomen:  Soft, nontender.  Positive bowel sounds.  No organomegaly or masses palpated.   Musculoskeletal:  No focal spinal tenderness to palpation.  Extremities: No peripheral edema or cyanosis.   Skin:  Benign.   Neuro:  Nonfocal.   LAB RESULTS: Lab Results  Component Value Date   WBC 5.8 09/04/2012   NEUTROABS 3.3 09/04/2012   HGB 13.8 09/04/2012   HCT 40.3 09/04/2012  MCV 92.1 09/04/2012   PLT 176 09/04/2012      Chemistry      Component Value Date/Time   NA 139 09/04/2012 1353   NA 138 01/11/2012 1508   K 3.7 09/04/2012 1353   K 4.0 01/11/2012 1508   CL 106 09/04/2012 1353   CL 103 01/11/2012 1508   CO2 25 09/04/2012 1353   CO2 27 01/11/2012 1508   BUN 19.0 09/04/2012 1353   BUN 18 01/11/2012 1508   CREATININE 0.8 09/04/2012 1353   CREATININE 0.69 01/11/2012 1508      Component Value Date/Time   CALCIUM 8.7 09/04/2012 1353   CALCIUM 9.2 01/11/2012 1508   ALKPHOS 67 09/04/2012 1353   ALKPHOS 46 01/11/2012 1508   AST 17 09/04/2012 1353   AST 18 01/11/2012 1508   ALT 17 09/04/2012 1353   ALT 15 01/11/2012 1508   BILITOT 0.44 09/04/2012 1353   BILITOT 0.4 01/11/2012 1508       Lab Results  Component Value Date   LABCA2 26 09/04/2012    STUDIES: Most recent bilateral mammogram at [facility] in June 2013 is unremarkable.  Most recent bone density at [facility] in January 2012 was normal.  ASSESSMENT: A 72 year old [place] woman status post left lumpectomy June of 2008 for a T1b N0 grade 1 invasive ductal carcinoma, ER+, HER2-, status post radiation completed in January of 2009, at which point  she was started on tamoxifen.  PLAN: I am very comfortable with Miranda Brady's stopping her tamoxifen at this point. We do have data for continuing tamoxifen up to 10 years, and in patients who have aggressive large tumors or lymph node involvement I think that is reasonable. In her situation I don't think it is necessary.   Accordingly she is "graduating" from breast cancer today. (She received her diploma and tassel.) She knows that we will be glad to see her at any point in the future if the need arises, but as of now no further appointments are being made for her here. Marland KitchenMAGRINAT,Envi Eagleson C    09/19/2012

## 2013-01-14 ENCOUNTER — Ambulatory Visit: Payer: Medicare Other | Admitting: Oncology

## 2014-01-10 ENCOUNTER — Ambulatory Visit (INDEPENDENT_AMBULATORY_CARE_PROVIDER_SITE_OTHER): Payer: Medicare Other | Admitting: Internal Medicine

## 2014-01-10 ENCOUNTER — Encounter: Payer: Self-pay | Admitting: Internal Medicine

## 2014-01-10 DIAGNOSIS — Z23 Encounter for immunization: Secondary | ICD-10-CM

## 2014-01-10 NOTE — Progress Notes (Signed)
  Subjective:    Miranda Brady is a 73 y.o. female who presents to the Infectious Disease clinic for travel consultation. Planned departure date: June 20th          Planned return date: 1 months Countries of travel: [place], [place], [place] and [place] Areas in country: rural   Accommodations: camping, hotel and tour group, sleeping in tents Purpose of travel: vacation Prior travel out of [place]: yes Currently ill / Fever: no History of liver or kidney disease: no  Data Review:  medical issues and medications reviewed   Review of Systems n/a    Objective:    n/a    Assessment:    No contraindications to travel. None       Plan:    Issues discussed: environmental concerns, future shots, insect-borne illnesses, malaria, MVA safety, rabies, safe food/water, traveler's diarrhea, website/handouts for more information, what to do if ill upon return, what to do if ill while there, Yellow Fever and I discussed yellow fever.  Her itinerary is with very low risk for disease but outside chance of needing it for entry to [place].  Letter provided for contraindicaiton due to age.  . Immunizations recommended: Hepatitis A series, Td and Typhoid (parenteral). Malaria prophylaxis: malarone, daily dose starting 1-2 days before entering endemic area, ending 7 days after leaving area Traveler's diarrhea prophylaxis: ciprofloxacin. Total duration of visit: 1 Hour. Total time spent on education, counseling, coordination of care: 30 Minutes.

## 2015-03-10 ENCOUNTER — Encounter: Payer: Self-pay | Admitting: Genetic Counselor

## 2024-08-16 ENCOUNTER — Telehealth: Payer: Self-pay

## 2024-08-16 NOTE — Telephone Encounter (Signed)
 12/19 @ 4:02 pm Received voicemail from [employer]. From [facility] requesting medical record from 2008.  Waiting on medical record release form before release records.

## 2024-08-19 ENCOUNTER — Telehealth: Payer: Self-pay

## 2024-08-19 NOTE — Telephone Encounter (Signed)
 12/21 Received Medical Record Release form.  Fax consult and treatment summary to [facility] and also sent copy to [facility] as requested.
